# Patient Record
Sex: Male | Born: 1955 | Race: White | Hispanic: No | Marital: Married | Smoking: Never smoker
Health system: Southern US, Community
[De-identification: ages and names within clinical notes are randomized; demographics above are authoritative.]

## PROBLEM LIST (undated history)

## (undated) DIAGNOSIS — S32009A Unspecified fracture of unspecified lumbar vertebra, initial encounter for closed fracture: Secondary | ICD-10-CM

## (undated) DIAGNOSIS — N189 Chronic kidney disease, unspecified: Secondary | ICD-10-CM

## (undated) DIAGNOSIS — I10 Essential (primary) hypertension: Secondary | ICD-10-CM

## (undated) DIAGNOSIS — M1990 Unspecified osteoarthritis, unspecified site: Secondary | ICD-10-CM

## (undated) DIAGNOSIS — K219 Gastro-esophageal reflux disease without esophagitis: Secondary | ICD-10-CM

## (undated) DIAGNOSIS — G4730 Sleep apnea, unspecified: Secondary | ICD-10-CM

## (undated) HISTORY — PX: HERNIA REPAIR: SHX51

## (undated) HISTORY — PX: INGUINAL HERNIA REPAIR: SHX194

## (undated) HISTORY — DX: Unspecified fracture of unspecified lumbar vertebra, initial encounter for closed fracture: S32.009A

---
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
## 2010-04-22 ENCOUNTER — Encounter (INDEPENDENT_AMBULATORY_CARE_PROVIDER_SITE_OTHER): Payer: Self-pay | Admitting: Emergency Medicine

## 2010-04-22 ENCOUNTER — Emergency Department (HOSPITAL_COMMUNITY): Admission: EM | Admit: 2010-04-22 | Discharge: 2010-04-22 | Payer: Self-pay | Admitting: Emergency Medicine

## 2010-04-22 ENCOUNTER — Ambulatory Visit: Payer: Self-pay | Admitting: Vascular Surgery

## 2010-05-16 ENCOUNTER — Encounter
Admission: RE | Admit: 2010-05-16 | Discharge: 2010-08-14 | Payer: Self-pay | Source: Home / Self Care | Admitting: Family Medicine

## 2017-01-29 ENCOUNTER — Telehealth: Payer: Self-pay

## 2017-01-29 NOTE — Telephone Encounter (Signed)
Gastroenterology Pre-Procedure Review  Request Date: Requesting Physician:   PATIENT REVIEW QUESTIONS: The patient responded to the following health history questions as indicated:    1. Diabetes Melitis: NO 2. Joint replacements in the past 12 months: NO 3. Major health problems in the past 3 months: NO 4. Has an artificial valve or MVP: NO 5. Has a defibrillator: NO 6. Has been advised in past to take antibiotics in advance of a procedure like teeth cleaning: NO 7. Family history of colon cancer: NO 8. Alcohol Use: NO 9. History of sleep apnea: YES 10. History of coronary artery or other vascular stents placed within the last 12 months: NO    MEDICATIONS & ALLERGIES:    Patient reports the following regarding taking any blood thinners:   Plavix? NO Aspirin? YES Coumadin? NO Brilinta? NO Xarelto? NO Eliquis? NO Pradaxa? NO Savaysa? NO Effient? NO  Patient confirms/reports the following medications:  Current Outpatient Prescriptions  Medication Sig Dispense Refill  . aspirin EC 81 MG tablet Take 81 mg by mouth daily.    [name] lisinopril (PRINIVIL,ZESTRIL) 2.5 MG tablet Take 2.5 mg by mouth daily.    [name] loratadine (CLARITIN) 10 MG tablet Take 10 mg by mouth daily.    . Multiple Vitamin (MULTIVITAMIN) capsule Take 1 capsule by mouth daily.     No current facility-administered medications for this visit.     Patient confirms/reports the following allergies:  No Known Allergies  No orders of the defined types were placed in this encounter.   AUTHORIZATION INFORMATION Primary Insurance: [place],  [place] #: [id number],  Group #: [id number] Pre-Cert / Berkley Harvey required:  Pre-Cert / Auth #:    SCHEDULE INFORMATION: Procedure has been scheduled as follows:  Date: , Time:   Location:   This Gastroenterology Pre-Precedure Review Form is being routed to the following provider(s):

## 2017-01-29 NOTE — Telephone Encounter (Signed)
[phone]  PATIENT RECEIVED LETTER TO SCHEDULE TCS

## 2017-01-30 NOTE — Telephone Encounter (Signed)
Ok to schedule. He has sleep apnea, have him write down his settings for his machine and bring them to the hospital in case he needs a CPAP after the procedure (if he uses a CPAP machine at home for his sleep apnea)

## 2017-02-04 NOTE — Telephone Encounter (Signed)
Tried to call with no answer  

## 2017-02-04 NOTE — Telephone Encounter (Signed)
Forwarding to Ginger who triaged.  

## 2017-02-05 ENCOUNTER — Other Ambulatory Visit: Payer: Self-pay

## 2017-02-05 DIAGNOSIS — Z1211 Encounter for screening for malignant neoplasm of colon: Secondary | ICD-10-CM

## 2017-02-05 MED ORDER — PEG 3350-KCL-NA BICARB-NACL 420 G PO SOLR: 4000.0000 mL | ORAL | 0 refills | Status: DC

## 2017-02-05 NOTE — Telephone Encounter (Signed)
LMOM to call back

## 2017-02-05 NOTE — Telephone Encounter (Signed)
Pt called back and he is wanting to wait until July. I have him set up for July 9 @ 12:00 pm

## 2017-04-15 ENCOUNTER — Encounter (HOSPITAL_COMMUNITY)
Admission: RE | Disposition: A | Discharge status: Home / Self Care | Payer: Self-pay | Source: Ambulatory Visit | Attending: Internal Medicine

## 2017-04-15 ENCOUNTER — Ambulatory Visit (HOSPITAL_COMMUNITY)
Admission: RE | Admit: 2017-04-15 | Discharge: 2017-04-15 | Disposition: A | Discharge status: Home / Self Care | Payer: BC Managed Care – PPO | Source: Ambulatory Visit | Attending: Internal Medicine | Admitting: Internal Medicine

## 2017-04-15 DIAGNOSIS — Z7982 Long term (current) use of aspirin: Secondary | ICD-10-CM | POA: Insufficient documentation

## 2017-04-15 DIAGNOSIS — Z1211 Encounter for screening for malignant neoplasm of colon: Secondary | ICD-10-CM | POA: Diagnosis present

## 2017-04-15 DIAGNOSIS — Z1212 Encounter for screening for malignant neoplasm of rectum: Secondary | ICD-10-CM | POA: Diagnosis not present

## 2017-04-15 DIAGNOSIS — K5730 Diverticulosis of large intestine without perforation or abscess without bleeding: Secondary | ICD-10-CM | POA: Diagnosis not present

## 2017-04-15 HISTORY — PX: COLONOSCOPY: SHX5424

## 2017-04-15 SURGERY — COLONOSCOPY
Anesthesia: Moderate Sedation

## 2017-04-15 MED ORDER — SODIUM CHLORIDE 0.9 % IV SOLN
INTRAVENOUS | Status: DC
  Administered 2017-04-15: 12:00:00 via INTRAVENOUS

## 2017-04-15 MED ORDER — MIDAZOLAM HCL 5 MG/5ML IJ SOLN
INTRAMUSCULAR | Status: DC | PRN
  Administered 2017-04-15 (×2): 2 mg via INTRAVENOUS

## 2017-04-15 MED ORDER — STERILE WATER FOR IRRIGATION IR SOLN
Status: DC | PRN
  Administered 2017-04-15: 100 mL

## 2017-04-15 MED ORDER — MEPERIDINE HCL 100 MG/ML IJ SOLN
INTRAMUSCULAR | Status: AC
  Filled 2017-04-15: qty 2

## 2017-04-15 MED ORDER — MEPERIDINE HCL 100 MG/ML IJ SOLN
INTRAMUSCULAR | Status: DC | PRN
  Administered 2017-04-15: 25 mg via INTRAVENOUS
  Administered 2017-04-15: 50 mg via INTRAVENOUS

## 2017-04-15 MED ORDER — ONDANSETRON HCL 4 MG/2ML IJ SOLN
INTRAMUSCULAR | Status: DC | PRN
  Administered 2017-04-15: 4 mg via INTRAVENOUS

## 2017-04-15 MED ORDER — ONDANSETRON HCL 4 MG/2ML IJ SOLN
INTRAMUSCULAR | Status: AC
  Filled 2017-04-15: qty 2

## 2017-04-15 MED ORDER — SIMETHICONE 40 MG/0.6ML PO SUSP
ORAL | Status: AC
  Filled 2017-04-15: qty 30

## 2017-04-15 MED ORDER — MIDAZOLAM HCL 5 MG/5ML IJ SOLN
INTRAMUSCULAR | Status: AC
  Filled 2017-04-15: qty 10

## 2017-04-15 NOTE — Op Note (Signed)
[facility] Patient Name: Daniel Patton Procedure Date: 04/15/2017 12:32 PM MRN: [id number] Date of Birth: [birth year] Attending MD: Gennette Pac , MD CSN: [id number] Age: 61 Admit Type: Outpatient Procedure:                Colonoscopy Indications:              Screening for colorectal malignant neoplasm Providers:                Gennette Pac, MD, Edrick Kins, RN, Burke Keels, Technician Referring MD:             Maryla Morrow. Modesto Charon, MD Medicines:                Midazolam 4 mg IV, Meperidine 75 mg IV Complications:            No immediate complications. Estimated Blood Loss:     Estimated blood loss: none. Procedure:                Pre-Anesthesia Assessment:                           - Prior to the procedure, a History and Physical                            was performed, and patient medications and                            allergies were reviewed. The patient's tolerance of                            previous anesthesia was also reviewed. The risks                            and benefits of the procedure and the sedation                            options and risks were discussed with the patient.                            All questions were answered, and informed consent                            was obtained. Prior Anticoagulants: The patient has                            taken no previous anticoagulant or antiplatelet                            agents. ASA Grade Assessment: II - A patient with                            mild systemic disease. After reviewing the risks  and benefits, the patient was deemed in                            satisfactory condition to undergo the procedure.                           After obtaining informed consent, the colonoscope                            was passed under direct vision. Throughout the                            procedure, the patient's blood pressure, pulse,  and                            oxygen saturations were monitored continuously. The                            EC-[id number] ([id number]) scope was introduced through                            the anus and advanced to the the cecum, identified                            by appendiceal orifice and ileocecal valve. The                            ileocecal valve, appendiceal orifice, and rectum                            were photographed. The ileocecal valve, appendiceal                            orifice, and rectum were photographed. The quality                            of the bowel preparation was adequate. Scope In: 12:46:38 PM Scope Out: 12:57:50 PM Scope Withdrawal Time: 0 hours 6 minutes 50 seconds  Total Procedure Duration: 0 hours 11 minutes 12 seconds  Findings:      The perianal and digital rectal examinations were normal.      Scattered small and large-mouthed diverticula were found in the sigmoid       colon and descending colon.      The exam was otherwise without abnormality on direct and retroflexion       views. Impression:               - Diverticulosis in the sigmoid colon and in the                            descending colon.                           - The examination was otherwise normal on direct  and retroflexion views.                           - No specimens collected. Moderate Sedation:      Moderate (conscious) sedation was administered by the endoscopy nurse       and supervised by the endoscopist. The following parameters were       monitored: oxygen saturation, heart rate, blood pressure, respiratory       rate, EKG, adequacy of pulmonary ventilation, and response to care.       Total physician intraservice time was 25 minutes.      Moderate (conscious) sedation was administered by the endoscopy nurse       and supervised by the endoscopist. The following parameters were       monitored: oxygen saturation, heart rate, blood pressure,  respiratory       rate, EKG, adequacy of pulmonary ventilation, and response to care.       Total physician intraservice time was 25 minutes. Recommendation:           - Patient has a contact number available for                            emergencies. The signs and symptoms of potential                            delayed complications were discussed with the                            patient. Return to normal activities tomorrow.                            Written discharge instructions were provided to the                            patient.                           - Resume previous diet.                           - Continue present medications.                           - Repeat colonoscopy in 10 years for screening                            purposes.                           - Return to GI clinic PRN. Procedure Code(s):        --- Professional ---                           [phone], Colonoscopy, flexible; diagnostic, including                            collection of specimen(s) by brushing or washing,  when performed (separate procedure)                           99152, Moderate sedation services provided by the                            same physician or other qualified health care                            professional performing the diagnostic or                            therapeutic service that the sedation supports,                            requiring the presence of an independent trained                            observer to assist in the monitoring of the                            patient's level of consciousness and physiological                            status; initial 15 minutes of intraservice time,                            patient age 33 years or older                           [phone], Moderate sedation services provided by the                            same physician or other qualified health care                             professional performing the diagnostic or                            therapeutic service that the sedation supports,                            requiring the presence of an independent trained                            observer to assist in the monitoring of the                            patient's level of consciousness and physiological                            status; initial 15 minutes of intraservice time,  patient age 23 years or older                           [phone], Moderate sedation services; each additional                            15 minutes intraservice time                           [phone], Moderate sedation services; each additional                            15 minutes intraservice time Diagnosis Code(s):        --- Professional ---                           Z12.11, Encounter for screening for malignant                            neoplasm of colon                           K57.30, Diverticulosis of large intestine without                            perforation or abscess without bleeding CPT copyright 2016 American Medical Association. All rights reserved. The codes documented in this report are preliminary and upon coder review may  be revised to meet current compliance requirements. Gerrit Friends. Rourk, MD Gennette Pac, MD 04/15/2017 1:04:13 PM This report has been signed electronically. Number of Addenda: 0

## 2017-04-15 NOTE — Discharge Instructions (Signed)
°Colonoscopy °Discharge Instructions ° °Read the instructions outlined below and refer to this sheet in the next few weeks. These discharge instructions provide you with general information on caring for yourself after you leave the hospital. Your doctor may also give you specific instructions. While your treatment has been planned according to the most current medical practices available, unavoidable complications occasionally occur. If you have any problems or questions after discharge, call Dr. Rourk at [phone]. °ACTIVITY °· You may resume your regular activity, but move at a slower pace for the next 24 hours.  °· Take frequent rest periods for the next 24 hours.  °· Walking will help get rid of the air and reduce the bloated feeling in your belly (abdomen).  °· No driving for 24 hours (because of the medicine (anesthesia) used during the test).   °· Do not sign any important legal documents or operate any machinery for 24 hours (because of the anesthesia used during the test).  °NUTRITION °· Drink plenty of fluids.  °· You may resume your normal diet as instructed by your doctor.  °· Begin with a light meal and progress to your normal diet. Heavy or fried foods are harder to digest and may make you feel sick to your stomach (nauseated).  °· Avoid alcoholic beverages for 24 hours or as instructed.  °MEDICATIONS °· You may resume your normal medications unless your doctor tells you otherwise.  °WHAT YOU CAN EXPECT TODAY °· Some feelings of bloating in the abdomen.  °· Passage of more gas than usual.  °· Spotting of blood in your stool or on the toilet paper.  °IF YOU HAD POLYPS REMOVED DURING THE COLONOSCOPY: °· No aspirin products for 7 days or as instructed.  °· No alcohol for 7 days or as instructed.  °· Eat a soft diet for the next 24 hours.  °FINDING OUT THE RESULTS OF YOUR TEST °Not all test results are available during your visit. If your test results are not back during the visit, make an appointment  with your caregiver to find out the results. Do not assume everything is normal if you have not heard from your caregiver or the medical facility. It is important for you to follow up on all of your test results.  °SEEK IMMEDIATE MEDICAL ATTENTION IF: °· You have more than a spotting of blood in your stool.  °· Your belly is swollen (abdominal distention).  °· You are nauseated or vomiting.  °· You have a temperature over 101.  °· You have abdominal pain or discomfort that is severe or gets worse throughout the day.  ° ° ° °Colon diverticulosis information provided ° °Recommend 1 more screening colonoscopy in 10 years ° ° °Diverticulosis °Diverticulosis is a condition that develops when small pouches (diverticula) form in the wall of the large intestine (colon). The colon is where water is absorbed and stool is formed. The pouches form when the inside layer of the colon pushes through weak spots in the outer layers of the colon. You may have a few pouches or many of them. °What are the causes? °The cause of this condition is not known. °What increases the risk? °The following factors may make you more likely to develop this condition: °· Being older than age 60. Your risk for this condition increases with age. Diverticulosis is rare among people younger than age 30. By age 80, many people have it. °· Eating a low-fiber diet. °· Having frequent constipation. °· Being overweight. °· Not   getting enough exercise. °· Smoking. °· Taking over-the-counter pain medicines, like aspirin and ibuprofen. °· Having a family history of diverticulosis. ° °What are the signs or symptoms? °In most people, there are no symptoms of this condition. If you do have symptoms, they may include: °· Bloating. °· Cramps in the abdomen. °· Constipation or diarrhea. °· Pain in the lower left side of the abdomen. ° °How is this diagnosed? °This condition is most often diagnosed during an exam for other colon problems. Because diverticulosis  usually has no symptoms, it often cannot be diagnosed independently. This condition may be diagnosed by: °· Using a flexible scope to examine the colon (colonoscopy). °· Taking an X-ray of the colon after dye has been put into the colon (barium enema). °· Doing a CT scan. ° °How is this treated? °You may not need treatment for this condition if you have never developed an infection related to diverticulosis. If you have had an infection before, treatment may include: °· Eating a high-fiber diet. This may include eating more fruits, vegetables, and grains. °· Taking a fiber supplement. °· Taking a live bacteria supplement (probiotic). °· Taking medicine to relax your colon. °· Taking antibiotic medicines. ° °Follow these instructions at home: °· Drink 6-8 glasses of water or more each day to prevent constipation. °· Try not to strain when you have a bowel movement. °· If you have had an infection before: °? Eat more fiber as directed by your health care provider or your diet and nutrition specialist (dietitian). °? Take a fiber supplement or probiotic, if your health care provider approves. °· Take over-the-counter and prescription medicines only as told by your health care provider. °· If you were prescribed an antibiotic, take it as told by your health care provider. Do not stop taking the antibiotic even if you start to feel better. °· Keep all follow-up visits as told by your health care provider. This is important. °Contact a health care provider if: °· You have pain in your abdomen. °· You have bloating. °· You have cramps. °· You have not had a bowel movement in 3 days. °Get help right away if: °· Your pain gets worse. °· Your bloating becomes very bad. °· You have a fever or chills, and your symptoms suddenly get worse. °· You vomit. °· You have bowel movements that are bloody or black. °· You have bleeding from your rectum. °Summary °· Diverticulosis is a condition that develops when small pouches  (diverticula) form in the wall of the large intestine (colon). °· You may have a few pouches or many of them. °· This condition is most often diagnosed during an exam for other colon problems. °· If you have had an infection related to diverticulosis, treatment may include increasing the fiber in your diet, taking supplements, or taking medicines. °This information is not intended to replace advice given to you by your health care provider. Make sure you discuss any questions you have with your health care provider. °Document Released: 06/21/2004 Document Revised: 08/13/2016 Document Reviewed: 08/13/2016 °Elsevier Interactive Patient Education © 2017 [employer] ° °

## 2017-04-15 NOTE — H&P (Signed)
@  [email or web address]@   Primary Care Physician:  Darrin Nipperollege, [facility] [place] Primary Gastroenterologist:  Dr. Jena Gaussourk  Pre-Procedure History & Physical: HPI:  Daniel Patton is a 61 y.o. male here for screening colonoscopy. Reportedly negative colonoscopy and even 10 years ago. No bowel symptoms. No family history of colon cancer.  No past medical history on file.  No past surgical history on file.  Prior to Admission medications   Medication Sig Start Date End Date Taking? Authorizing Provider  acetaminophen (TYLENOL) 500 MG tablet Take 1,000 mg by mouth every 6 (six) hours as needed for mild pain or moderate pain.   Yes [provider]  amLODipine (NORVASC) 2.5 MG tablet Take 2.5 mg by mouth daily. 01/07/17  Yes [provider]  aspirin EC 81 MG tablet Take 81 mg by mouth daily.   Yes [provider]  loratadine (CLARITIN) 10 MG tablet Take 10 mg by mouth daily.   Yes [provider]  Multiple Vitamin (MULTIVITAMIN) capsule Take 1 capsule by mouth daily.   Yes [provider]  omeprazole (PRILOSEC) 20 MG capsule Take 20 mg by mouth daily. 01/07/17  Yes [provider]  tetrahydrozoline-zinc (VISINE-AC) 0.05-0.25 % ophthalmic solution Place 2 drops into both eyes daily as needed (dry, itchy eyes).   Yes [provider]    Allergies as of 02/05/2017  . (No Known Allergies)    No family history on file.  Social History   Social History  . Marital status: Married    Spouse name: N/A  . Number of children: N/A  . Years of education: N/A   Occupational History  . Not on file.   Social History Main Topics  . Smoking status: Not on file  . Smokeless tobacco: Not on file  . Alcohol use Not on file  . Drug use: Unknown  . Sexual activity: Not on file   Other Topics Concern  . Not on file   Social History Narrative  . No narrative on file    Review of Systems: See HPI, otherwise negative ROS  Physical Exam: BP (!)  137/95   Pulse 82   Temp 97.9 F (36.6 C) (Oral)   Resp 20   Ht 5\' 7"  (1.702 m)   Wt 204 lb (92.5 kg)   SpO2 99%   BMI 31.95 kg/m  General:   Alert,  Well-developed, well-nourished, pleasant and cooperative in NAD Neck:  Supple; no masses or thyromegaly. No significant cervical adenopathy. Lungs:  Clear throughout to auscultation.   No wheezes, crackles, or rhonchi. No acute distress. Heart:  Regular rate and rhythm; no murmurs, clicks, rubs,  or gallops. Abdomen: Non-distended, normal bowel sounds.  Soft and nontender without appreciable mass or hepatosplenomegaly.  Pulses:  Normal pulses noted. Extremities:  Without clubbing or edema.  Impression/ Recommendations:  Pleasant 61 year old gentleman here for average risk screening colonoscopy. The risks, benefits, limitations, alternatives and imponderables have been reviewed with the patient. Questions have been answered. All parties are agreeable.     Notice: This dictation was prepared with Dragon dictation along with smaller phrase technology. Any transcriptional errors that result from this process are unintentional and may not be corrected upon review.

## 2017-04-18 ENCOUNTER — Encounter (HOSPITAL_COMMUNITY): Payer: Self-pay | Admitting: Internal Medicine

## 2017-08-08 DIAGNOSIS — S32009A Unspecified fracture of unspecified lumbar vertebra, initial encounter for closed fracture: Secondary | ICD-10-CM

## 2017-08-08 HISTORY — DX: Unspecified fracture of unspecified lumbar vertebra, initial encounter for closed fracture: S32.009A

## 2017-08-30 ENCOUNTER — Inpatient Hospital Stay (HOSPITAL_COMMUNITY)
Admit: 2017-08-30 | Discharge: 2017-09-10 | DRG: 963 | Disposition: A | Discharge status: Rehabilitation Facility | Payer: BC Managed Care – PPO | Attending: Surgery | Admitting: Surgery

## 2017-08-30 ENCOUNTER — Emergency Department (HOSPITAL_COMMUNITY): Payer: BC Managed Care – PPO

## 2017-08-30 DIAGNOSIS — S32018A Other fracture of first lumbar vertebra, initial encounter for closed fracture: Secondary | ICD-10-CM | POA: Diagnosis present

## 2017-08-30 DIAGNOSIS — S32028A Other fracture of second lumbar vertebra, initial encounter for closed fracture: Secondary | ICD-10-CM | POA: Diagnosis present

## 2017-08-30 DIAGNOSIS — S272XXA Traumatic hemopneumothorax, initial encounter: Principal | ICD-10-CM | POA: Diagnosis present

## 2017-08-30 DIAGNOSIS — T508X5A Adverse effect of diagnostic agents, initial encounter: Secondary | ICD-10-CM | POA: Diagnosis not present

## 2017-08-30 DIAGNOSIS — Z79899 Other long term (current) drug therapy: Secondary | ICD-10-CM | POA: Diagnosis not present

## 2017-08-30 DIAGNOSIS — K567 Ileus, unspecified: Secondary | ICD-10-CM | POA: Diagnosis not present

## 2017-08-30 DIAGNOSIS — J9819 Other pulmonary collapse: Secondary | ICD-10-CM | POA: Diagnosis present

## 2017-08-30 DIAGNOSIS — R7989 Other specified abnormal findings of blood chemistry: Secondary | ICD-10-CM | POA: Diagnosis not present

## 2017-08-30 DIAGNOSIS — S060X9S Concussion with loss of consciousness of unspecified duration, sequela: Secondary | ICD-10-CM | POA: Diagnosis not present

## 2017-08-30 DIAGNOSIS — I82492 Acute embolism and thrombosis of other specified deep vein of left lower extremity: Secondary | ICD-10-CM | POA: Diagnosis present

## 2017-08-30 DIAGNOSIS — E872 Acidosis: Secondary | ICD-10-CM | POA: Diagnosis present

## 2017-08-30 DIAGNOSIS — N390 Urinary tract infection, site not specified: Secondary | ICD-10-CM | POA: Diagnosis not present

## 2017-08-30 DIAGNOSIS — D62 Acute posthemorrhagic anemia: Secondary | ICD-10-CM | POA: Diagnosis present

## 2017-08-30 DIAGNOSIS — S22088A Other fracture of T11-T12 vertebra, initial encounter for closed fracture: Secondary | ICD-10-CM | POA: Diagnosis present

## 2017-08-30 DIAGNOSIS — Z882 Allergy status to sulfonamides status: Secondary | ICD-10-CM | POA: Diagnosis not present

## 2017-08-30 DIAGNOSIS — S32009S Unspecified fracture of unspecified lumbar vertebra, sequela: Secondary | ICD-10-CM | POA: Diagnosis not present

## 2017-08-30 DIAGNOSIS — S271XXD Traumatic hemothorax, subsequent encounter: Secondary | ICD-10-CM | POA: Diagnosis not present

## 2017-08-30 DIAGNOSIS — N183 Chronic kidney disease, stage 3 (moderate): Secondary | ICD-10-CM | POA: Diagnosis present

## 2017-08-30 DIAGNOSIS — N170 Acute kidney failure with tubular necrosis: Secondary | ICD-10-CM | POA: Diagnosis present

## 2017-08-30 DIAGNOSIS — T07XXXA Unspecified multiple injuries, initial encounter: Secondary | ICD-10-CM | POA: Diagnosis not present

## 2017-08-30 DIAGNOSIS — R402252 Coma scale, best verbal response, oriented, at arrival to emergency department: Secondary | ICD-10-CM | POA: Diagnosis present

## 2017-08-30 DIAGNOSIS — Z9689 Presence of other specified functional implants: Secondary | ICD-10-CM

## 2017-08-30 DIAGNOSIS — Z7982 Long term (current) use of aspirin: Secondary | ICD-10-CM | POA: Diagnosis not present

## 2017-08-30 DIAGNOSIS — S334XXA Traumatic rupture of symphysis pubis, initial encounter: Secondary | ICD-10-CM | POA: Diagnosis present

## 2017-08-30 DIAGNOSIS — J942 Hemothorax: Secondary | ICD-10-CM

## 2017-08-30 DIAGNOSIS — S35403A Unspecified injury of unspecified renal artery, initial encounter: Secondary | ICD-10-CM | POA: Diagnosis present

## 2017-08-30 DIAGNOSIS — I82442 Acute embolism and thrombosis of left tibial vein: Secondary | ICD-10-CM | POA: Diagnosis present

## 2017-08-30 DIAGNOSIS — S36039A Unspecified laceration of spleen, initial encounter: Secondary | ICD-10-CM | POA: Diagnosis present

## 2017-08-30 DIAGNOSIS — S35402A Unspecified injury of left renal artery, initial encounter: Secondary | ICD-10-CM | POA: Diagnosis present

## 2017-08-30 DIAGNOSIS — Z23 Encounter for immunization: Secondary | ICD-10-CM

## 2017-08-30 DIAGNOSIS — S330XXA Traumatic rupture of lumbar intervertebral disc, initial encounter: Secondary | ICD-10-CM | POA: Diagnosis present

## 2017-08-30 DIAGNOSIS — R402142 Coma scale, eyes open, spontaneous, at arrival to emergency department: Secondary | ICD-10-CM | POA: Diagnosis present

## 2017-08-30 DIAGNOSIS — S060X9A Concussion with loss of consciousness of unspecified duration, initial encounter: Secondary | ICD-10-CM | POA: Diagnosis present

## 2017-08-30 DIAGNOSIS — R000 Tachycardia, unspecified: Secondary | ICD-10-CM | POA: Diagnosis not present

## 2017-08-30 DIAGNOSIS — S36039D Unspecified laceration of spleen, subsequent encounter: Secondary | ICD-10-CM | POA: Diagnosis not present

## 2017-08-30 DIAGNOSIS — I959 Hypotension, unspecified: Secondary | ICD-10-CM | POA: Diagnosis present

## 2017-08-30 DIAGNOSIS — T1490XA Injury, unspecified, initial encounter: Secondary | ICD-10-CM

## 2017-08-30 DIAGNOSIS — D735 Infarction of spleen: Secondary | ICD-10-CM | POA: Diagnosis present

## 2017-08-30 DIAGNOSIS — N179 Acute kidney failure, unspecified: Secondary | ICD-10-CM | POA: Diagnosis present

## 2017-08-30 DIAGNOSIS — S060X0S Concussion without loss of consciousness, sequela: Secondary | ICD-10-CM | POA: Diagnosis not present

## 2017-08-30 DIAGNOSIS — R402362 Coma scale, best motor response, obeys commands, at arrival to emergency department: Secondary | ICD-10-CM | POA: Diagnosis present

## 2017-08-30 DIAGNOSIS — S32018D Other fracture of first lumbar vertebra, subsequent encounter for fracture with routine healing: Secondary | ICD-10-CM | POA: Diagnosis present

## 2017-08-30 DIAGNOSIS — B9620 Unspecified Escherichia coli [E. coli] as the cause of diseases classified elsewhere: Secondary | ICD-10-CM | POA: Diagnosis not present

## 2017-08-30 DIAGNOSIS — Z91013 Allergy to seafood: Secondary | ICD-10-CM

## 2017-08-30 DIAGNOSIS — Z881 Allergy status to other antibiotic agents status: Secondary | ICD-10-CM

## 2017-08-30 DIAGNOSIS — S2242XA Multiple fractures of ribs, left side, initial encounter for closed fracture: Secondary | ICD-10-CM

## 2017-08-30 DIAGNOSIS — M7989 Other specified soft tissue disorders: Secondary | ICD-10-CM | POA: Diagnosis not present

## 2017-08-30 DIAGNOSIS — Z4682 Encounter for fitting and adjustment of non-vascular catheter: Secondary | ICD-10-CM

## 2017-08-30 DIAGNOSIS — J939 Pneumothorax, unspecified: Secondary | ICD-10-CM

## 2017-08-30 DIAGNOSIS — T859XXA Unspecified complication of internal prosthetic device, implant and graft, initial encounter: Secondary | ICD-10-CM

## 2017-08-30 DIAGNOSIS — K560 Paralytic ileus: Secondary | ICD-10-CM

## 2017-08-30 DIAGNOSIS — I824Z2 Acute embolism and thrombosis of unspecified deep veins of left distal lower extremity: Secondary | ICD-10-CM | POA: Diagnosis not present

## 2017-08-30 DIAGNOSIS — S2239XA Fracture of one rib, unspecified side, initial encounter for closed fracture: Secondary | ICD-10-CM

## 2017-08-30 DIAGNOSIS — S32028D Other fracture of second lumbar vertebra, subsequent encounter for fracture with routine healing: Secondary | ICD-10-CM | POA: Diagnosis not present

## 2017-08-30 DIAGNOSIS — M79662 Pain in left lower leg: Secondary | ICD-10-CM | POA: Diagnosis not present

## 2017-08-30 DIAGNOSIS — A499 Bacterial infection, unspecified: Secondary | ICD-10-CM | POA: Diagnosis not present

## 2017-08-30 DIAGNOSIS — E875 Hyperkalemia: Secondary | ICD-10-CM | POA: Diagnosis not present

## 2017-08-30 DIAGNOSIS — S2242XD Multiple fractures of ribs, left side, subsequent encounter for fracture with routine healing: Secondary | ICD-10-CM | POA: Diagnosis not present

## 2017-08-30 DIAGNOSIS — I129 Hypertensive chronic kidney disease with stage 1 through stage 4 chronic kidney disease, or unspecified chronic kidney disease: Secondary | ICD-10-CM | POA: Diagnosis present

## 2017-08-30 DIAGNOSIS — M545 Low back pain: Secondary | ICD-10-CM | POA: Diagnosis present

## 2017-08-30 LAB — ABO/RH: ABO/RH(D): A POS

## 2017-08-30 LAB — CBC WITH DIFFERENTIAL/PLATELET
Basophils Absolute: 0 10*3/uL (ref 0.0–0.1)
Basophils Relative: 0 %
Eosinophils Absolute: 0.3 10*3/uL (ref 0.0–0.7)
Eosinophils Relative: 2 %
HCT: 33.6 % — ABNORMAL LOW (ref 39.0–52.0)
Hemoglobin: 11.5 g/dL — ABNORMAL LOW (ref 13.0–17.0)
Lymphocytes Relative: 26 %
Lymphs Abs: 4.4 10*3/uL — ABNORMAL HIGH (ref 0.7–4.0)
MCH: 31.3 pg (ref 26.0–34.0)
MCHC: 34.2 g/dL (ref 30.0–36.0)
MCV: 91.3 fL (ref 78.0–100.0)
Monocytes Absolute: 0.9 10*3/uL (ref 0.1–1.0)
Monocytes Relative: 5 %
Neutro Abs: 11.5 10*3/uL — ABNORMAL HIGH (ref 1.7–7.7)
Neutrophils Relative %: 67 %
Platelets: 219 10*3/uL (ref 150–400)
RBC: 3.68 MIL/uL — ABNORMAL LOW (ref 4.22–5.81)
RDW: 12.9 % (ref 11.5–15.5)
WBC: 17.1 10*3/uL — ABNORMAL HIGH (ref 4.0–10.5)

## 2017-08-30 LAB — CBC
HCT: 37.7 % — ABNORMAL LOW (ref 39.0–52.0)
Hemoglobin: 13.2 g/dL (ref 13.0–17.0)
MCH: 30.8 pg (ref 26.0–34.0)
MCHC: 35 g/dL (ref 30.0–36.0)
MCV: 87.9 fL (ref 78.0–100.0)
PLATELETS: 178 10*3/uL (ref 150–400)
RBC: 4.29 MIL/uL (ref 4.22–5.81)
RDW: 14.4 % (ref 11.5–15.5)
WBC: 16.4 10*3/uL — AB (ref 4.0–10.5)

## 2017-08-30 LAB — I-STAT CG4 LACTIC ACID, ED: LACTIC ACID, VENOUS: 3.62 mmol/L — AB (ref 0.5–1.9)

## 2017-08-30 LAB — PREPARE FRESH FROZEN PLASMA
UNIT DIVISION: 0
Unit division: 0

## 2017-08-30 LAB — COMPREHENSIVE METABOLIC PANEL
ALT: 61 U/L (ref 17–63)
AST: 88 U/L — ABNORMAL HIGH (ref 15–41)
Albumin: 3.2 g/dL — ABNORMAL LOW (ref 3.5–5.0)
Alkaline Phosphatase: 46 U/L (ref 38–126)
Anion gap: 8 (ref 5–15)
BUN: 16 mg/dL (ref 6–20)
CO2: 21 mmol/L — ABNORMAL LOW (ref 22–32)
Calcium: 8.2 mg/dL — ABNORMAL LOW (ref 8.9–10.3)
Chloride: 112 mmol/L — ABNORMAL HIGH (ref 101–111)
Creatinine, Ser: 1.66 mg/dL — ABNORMAL HIGH (ref 0.61–1.24)
GFR calc Af Amer: 28 mL/min — ABNORMAL LOW (ref >60)
GFR calc non Af Amer: 24 mL/min — ABNORMAL LOW (ref >60)
Glucose, Bld: 134 mg/dL — ABNORMAL HIGH (ref 65–99)
Potassium: 4 mmol/L (ref 3.5–5.1)
Sodium: 141 mmol/L (ref 135–145)
Total Bilirubin: 1.2 mg/dL (ref 0.3–1.2)
Total Protein: 5.5 g/dL — ABNORMAL LOW (ref 6.5–8.1)

## 2017-08-30 LAB — BPAM FFP
BLOOD PRODUCT EXPIRATION DATE: 201812012359
BLOOD PRODUCT EXPIRATION DATE: 201812042359
ISSUE DATE / TIME: 201811231403
ISSUE DATE / TIME: 201811231403
UNIT TYPE AND RH: 6200
Unit Type and Rh: 600

## 2017-08-30 LAB — APTT: APTT: 26 s (ref 24–36)

## 2017-08-30 LAB — ETHANOL

## 2017-08-30 LAB — LACTIC ACID, PLASMA: Lactic Acid, Venous: 2.3 mmol/L (ref 0.5–1.9)

## 2017-08-30 LAB — I-STAT CHEM 8, ED
BUN: 18 mg/dL (ref 6–20)
CREATININE: 1.6 mg/dL — AB (ref 0.61–1.24)
Calcium, Ion: 1.09 mmol/L — ABNORMAL LOW (ref 1.15–1.40)
Chloride: 108 mmol/L (ref 101–111)
GLUCOSE: 134 mg/dL — AB (ref 65–99)
HEMATOCRIT: 32 % — AB (ref 39.0–52.0)
HEMOGLOBIN: 10.9 g/dL — AB (ref 13.0–17.0)
POTASSIUM: 3.8 mmol/L (ref 3.5–5.1)
Sodium: 141 mmol/L (ref 135–145)
TCO2: 22 mmol/L (ref 22–32)

## 2017-08-30 LAB — CDS SEROLOGY

## 2017-08-30 LAB — MRSA PCR SCREENING: MRSA by PCR: NEGATIVE

## 2017-08-30 LAB — FIBRINOGEN: Fibrinogen: 299 mg/dL (ref 210–475)

## 2017-08-30 LAB — PROTIME-INR
INR: 1.27
PROTHROMBIN TIME: 15.8 s — AB (ref 11.4–15.2)

## 2017-08-30 LAB — PREPARE RBC (CROSSMATCH)

## 2017-08-30 MED ORDER — ONDANSETRON HCL 4 MG/2ML IJ SOLN
4.0000 mg | Freq: Once | INTRAMUSCULAR | Status: AC
  Administered 2017-08-30: 4 mg via INTRAVENOUS

## 2017-08-30 MED ORDER — SODIUM CHLORIDE 0.9 % IV BOLUS (SEPSIS)
2000.0000 mL | Freq: Once | INTRAVENOUS | Status: AC
  Administered 2017-08-30: 2000 mL via INTRAVENOUS

## 2017-08-30 MED ORDER — METOPROLOL TARTRATE 5 MG/5ML IV SOLN: 5.0000 mg | Freq: Four times a day (QID) | INTRAVENOUS | Status: DC | PRN

## 2017-08-30 MED ORDER — METHOCARBAMOL 1000 MG/10ML IJ SOLN
500.0000 mg | Freq: Four times a day (QID) | INTRAVENOUS | Status: DC
  Administered 2017-08-30 – 2017-09-07 (×31): 500 mg via INTRAVENOUS
  Filled 2017-08-30: qty 5
  Filled 2017-08-30: qty 550
  Filled 2017-08-30 (×11): qty 5
  Filled 2017-08-30: qty 550
  Filled 2017-08-30 (×2): qty 5
  Filled 2017-08-30: qty 550
  Filled 2017-08-30 (×4): qty 5
  Filled 2017-08-30: qty 550
  Filled 2017-08-30 (×3): qty 5
  Filled 2017-08-30 (×3): qty 550
  Filled 2017-08-30 (×2): qty 5
  Filled 2017-08-30: qty 550
  Filled 2017-08-30 (×3): qty 5

## 2017-08-30 MED ORDER — ACETAMINOPHEN 10 MG/ML IV SOLN
1000.0000 mg | Freq: Four times a day (QID) | INTRAVENOUS | Status: AC
  Administered 2017-08-30 – 2017-08-31 (×4): 1000 mg via INTRAVENOUS
  Filled 2017-08-30 (×4): qty 100

## 2017-08-30 MED ORDER — DOCUSATE SODIUM 100 MG PO CAPS: 100.0000 mg | ORAL_CAPSULE | Freq: Two times a day (BID) | ORAL | Status: DC

## 2017-08-30 MED ORDER — OXYCODONE HCL 5 MG PO TABS: 5.0000 mg | ORAL_TABLET | ORAL | Status: DC | PRN

## 2017-08-30 MED ORDER — SODIUM CHLORIDE 0.9 % IV BOLUS (SEPSIS)
1000.0000 mL | Freq: Once | INTRAVENOUS | Status: AC
  Administered 2017-08-30: 1000 mL via INTRAVENOUS

## 2017-08-30 MED ORDER — HYDRALAZINE HCL 20 MG/ML IJ SOLN: 10.0000 mg | INTRAMUSCULAR | Status: DC | PRN

## 2017-08-30 MED ORDER — IOPAMIDOL (ISOVUE-300) INJECTION 61%
100.0000 mL | Freq: Once | INTRAVENOUS | Status: AC | PRN
  Administered 2017-08-30: 100 mL via INTRAVENOUS

## 2017-08-30 MED ORDER — FENTANYL CITRATE (PF) 100 MCG/2ML IJ SOLN
INTRAMUSCULAR | Status: AC | PRN
  Administered 2017-08-30: 100 ug via INTRAVENOUS

## 2017-08-30 MED ORDER — ONDANSETRON HCL 4 MG/2ML IJ SOLN
4.0000 mg | Freq: Four times a day (QID) | INTRAMUSCULAR | Status: DC | PRN
  Administered 2017-08-30 – 2017-09-02 (×4): 4 mg via INTRAVENOUS
  Filled 2017-08-30 (×4): qty 2

## 2017-08-30 MED ORDER — ACETAMINOPHEN 325 MG PO TABS: 650.0000 mg | ORAL_TABLET | ORAL | Status: DC | PRN

## 2017-08-30 MED ORDER — FENTANYL CITRATE (PF) 100 MCG/2ML IJ SOLN
25.0000 ug | INTRAMUSCULAR | Status: DC | PRN
  Administered 2017-08-30 (×3): 50 ug via INTRAVENOUS
  Administered 2017-08-31: 25 ug via INTRAVENOUS
  Administered 2017-08-31 (×3): 50 ug via INTRAVENOUS
  Administered 2017-08-31: 25 ug via INTRAVENOUS
  Administered 2017-08-31: 50 ug via INTRAVENOUS
  Administered 2017-08-31: 25 ug via INTRAVENOUS
  Administered 2017-08-31 – 2017-09-02 (×5): 50 ug via INTRAVENOUS
  Filled 2017-08-30 (×15): qty 2

## 2017-08-30 MED ORDER — PANTOPRAZOLE SODIUM 40 MG PO TBEC
40.0000 mg | DELAYED_RELEASE_TABLET | Freq: Every day | ORAL | Status: DC
  Administered 2017-09-01 – 2017-09-10 (×7): 40 mg via ORAL
  Filled 2017-08-30 (×7): qty 1

## 2017-08-30 MED ORDER — TETANUS-DIPHTH-ACELL PERTUSSIS 5-2.5-18.5 LF-MCG/0.5 IM SUSP
0.5000 mL | Freq: Once | INTRAMUSCULAR | Status: AC
  Administered 2017-08-30: 0.5 mL via INTRAMUSCULAR
  Filled 2017-08-30: qty 0.5

## 2017-08-30 MED ORDER — FENTANYL CITRATE (PF) 100 MCG/2ML IJ SOLN
INTRAMUSCULAR | Status: AC | PRN
  Administered 2017-08-30: 50 ug via INTRAVENOUS

## 2017-08-30 MED ORDER — SODIUM CHLORIDE 0.9 % IV SOLN: Freq: Once | INTRAVENOUS | Status: DC

## 2017-08-30 MED ORDER — FENTANYL CITRATE (PF) 100 MCG/2ML IJ SOLN
INTRAMUSCULAR | Status: AC
  Filled 2017-08-30: qty 2

## 2017-08-30 MED ORDER — ONDANSETRON 4 MG PO TBDP
4.0000 mg | ORAL_TABLET | Freq: Four times a day (QID) | ORAL | Status: DC | PRN
Start: 2017-08-30 — End: 2017-09-10

## 2017-08-30 MED ORDER — PANTOPRAZOLE SODIUM 40 MG IV SOLR
40.0000 mg | Freq: Every day | INTRAVENOUS | Status: DC
  Administered 2017-08-31 – 2017-09-04 (×5): 40 mg via INTRAVENOUS
  Filled 2017-08-30 (×5): qty 40

## 2017-08-30 MED ORDER — HYDROMORPHONE HCL 1 MG/ML IJ SOLN
0.5000 mg | INTRAMUSCULAR | Status: DC | PRN
  Administered 2017-08-30 – 2017-08-31 (×7): 0.5 mg via INTRAVENOUS
  Filled 2017-08-30 (×5): qty 0.5
  Filled 2017-08-30: qty 1
  Filled 2017-08-30: qty 0.5

## 2017-08-30 MED ORDER — OXYCODONE HCL 5 MG PO TABS: 10.0000 mg | ORAL_TABLET | ORAL | Status: DC | PRN

## 2017-08-30 MED ORDER — ONDANSETRON HCL 4 MG/2ML IJ SOLN
4.0000 mg | Freq: Once | INTRAMUSCULAR | Status: AC
  Administered 2017-08-30: 4 mg via INTRAVENOUS
  Filled 2017-08-30: qty 2

## 2017-08-30 MED ORDER — SODIUM CHLORIDE 0.9 % IV SOLN
INTRAVENOUS | Status: DC
  Administered 2017-08-30 – 2017-09-04 (×4): via INTRAVENOUS
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
## 2017-08-30 NOTE — ED Notes (Signed)
2nd unit of blood finished at 14:59
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
## 2017-08-30 NOTE — ED Notes (Addendum)
2nd unit of blood started at 1454 per Dr. Doylene Canardonner.
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
## 2017-08-30 NOTE — ED Notes (Addendum)
First unit of blood started at 14:47 per Dr. Fredricka Bonineonnor.

## 2017-08-30 NOTE — Progress Notes (Addendum)
Patient continues to complain of back pain only. He is alert and appropriate.  Now in ICU. BP has drifted back to low 80s systolic, MAPs in mid 60s. He maintained SBP in low 100s for over an hour before leaving the ER, sounds like he did get a dose of dilaudid on the way up. Chest tube output has slowed down, only about 50 in the last hour.  He likely has ongoing retroperitoneal venous bleeding which hopefully will stop. Transfuse 2 more units PRBC now, will order FFP and PLT as well

## 2017-08-30 NOTE — ED Notes (Signed)
Dr. Juleen ChinaKohut aware of lactic acid result of 3.6

## 2017-08-30 NOTE — ED Notes (Signed)
1st unit of blood finished at 1453
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
## 2017-08-30 NOTE — Procedures (Signed)
Chest Tube Insertion Procedure Note  Indications:  Clinically significant Pneumothorax and Hemothorax  Pre-operative Diagnosis: Pneumothorax and Hemothorax  Post-operative Diagnosis: Pneumothorax and Hemothorax  Procedure Details  Informed consent was obtained for the procedure  Risks of lung perforation, hemorrhage, arrhythmia, and adverse drug reaction were discussed.   After sterile skin prep, using standard technique, a 14 French tube was placed in the left lateral 5th rib space.  Findings: Blood and air return, initial blood out  Estimated Blood Loss:  Minimal         Specimens:  None              Complications:  None; patient tolerated the procedure well.         Disposition: to be determined         Condition: guarded  Attending Attestation: I performed the procedure.

## 2017-08-30 NOTE — ED Notes (Signed)
Report given to 4N RN.  Patient stable and able to be transported at this time. All belongings taken with patient to the room.  Family at bedside and is going to see other family then going to floor.  Patient is alert to transport.

## 2017-08-31 ENCOUNTER — Inpatient Hospital Stay (HOSPITAL_COMMUNITY): Payer: BC Managed Care – PPO

## 2017-08-31 LAB — CBC
HCT: 37.9 % — ABNORMAL LOW (ref 39.0–52.0)
HCT: 33.1 % — ABNORMAL LOW (ref 39.0–52.0)
HEMATOCRIT: 38.5 % — AB (ref 39.0–52.0)
HEMOGLOBIN: 12.9 g/dL — AB (ref 13.0–17.0)
Hemoglobin: 12.8 g/dL — ABNORMAL LOW (ref 13.0–17.0)
Hemoglobin: 11.2 g/dL — ABNORMAL LOW (ref 13.0–17.0)
MCH: 29.5 pg (ref 26.0–34.0)
MCH: 29.6 pg (ref 26.0–34.0)
MCH: 29.9 pg (ref 26.0–34.0)
MCHC: 34 g/dL (ref 30.0–36.0)
MCHC: 33.2 g/dL (ref 30.0–36.0)
MCHC: 33.8 g/dL (ref 30.0–36.0)
MCV: 86.7 fL (ref 78.0–100.0)
MCV: 88.9 fL (ref 78.0–100.0)
MCV: 88.5 fL (ref 78.0–100.0)
PLATELETS: 184 10*3/uL (ref 150–400)
PLATELETS: 209 10*3/uL (ref 150–400)
Platelets: 181 10*3/uL (ref 150–400)
RBC: 4.37 MIL/uL (ref 4.22–5.81)
RBC: 4.33 MIL/uL (ref 4.22–5.81)
RBC: 3.74 MIL/uL — AB (ref 4.22–5.81)
RDW: 14.9 % (ref 11.5–15.5)
RDW: 15.6 % — AB (ref 11.5–15.5)
RDW: 15.6 % — ABNORMAL HIGH (ref 11.5–15.5)
WBC: 13.9 10*3/uL — AB (ref 4.0–10.5)
WBC: 13.8 10*3/uL — ABNORMAL HIGH (ref 4.0–10.5)
WBC: 13.8 10*3/uL — ABNORMAL HIGH (ref 4.0–10.5)

## 2017-08-31 LAB — PREPARE PLATELET PHERESIS: UNIT DIVISION: 0

## 2017-08-31 LAB — URINALYSIS, ROUTINE W REFLEX MICROSCOPIC
Bilirubin Urine: NEGATIVE
Glucose, UA: NEGATIVE mg/dL
Ketones, ur: NEGATIVE mg/dL
Leukocytes, UA: NEGATIVE
NITRITE: NEGATIVE
PH: 5 (ref 5.0–8.0)
Protein, ur: 100 mg/dL — AB
SPECIFIC GRAVITY, URINE: 1.032 — AB (ref 1.005–1.030)

## 2017-08-31 LAB — HIV ANTIBODY (ROUTINE TESTING W REFLEX): HIV SCREEN 4TH GENERATION: NONREACTIVE

## 2017-08-31 LAB — COMPREHENSIVE METABOLIC PANEL
ALBUMIN: 2.7 g/dL — AB (ref 3.5–5.0)
ALK PHOS: 38 U/L (ref 38–126)
ALT: 66 U/L — ABNORMAL HIGH (ref 17–63)
AST: 128 U/L — AB (ref 15–41)
Anion gap: 7 (ref 5–15)
BILIRUBIN TOTAL: 1.8 mg/dL — AB (ref 0.3–1.2)
BUN: 24 mg/dL — AB (ref 6–20)
CALCIUM: 7.2 mg/dL — AB (ref 8.9–10.3)
CO2: 17 mmol/L — ABNORMAL LOW (ref 22–32)
Chloride: 114 mmol/L — ABNORMAL HIGH (ref 101–111)
Creatinine, Ser: 2.32 mg/dL — ABNORMAL HIGH (ref 0.61–1.24)
GFR calc Af Amer: 33 mL/min — ABNORMAL LOW (ref >60)
GFR, EST NON AFRICAN AMERICAN: 28 mL/min — AB (ref >60)
GLUCOSE: 143 mg/dL — AB (ref 65–99)
Potassium: 5.2 mmol/L — ABNORMAL HIGH (ref 3.5–5.1)
Sodium: 138 mmol/L (ref 135–145)
TOTAL PROTEIN: 5 g/dL — AB (ref 6.5–8.1)

## 2017-08-31 LAB — BPAM PLATELET PHERESIS
BLOOD PRODUCT EXPIRATION DATE: 201811252359
ISSUE DATE / TIME: 201811231824
Unit Type and Rh: 600

## 2017-08-31 LAB — BPAM FFP
Blood Product Expiration Date: 201811282359
ISSUE DATE / TIME: 201811232018
Unit Type and Rh: 6200

## 2017-08-31 LAB — PREPARE FRESH FROZEN PLASMA: UNIT DIVISION: 0

## 2017-08-31 LAB — LACTIC ACID, PLASMA: LACTIC ACID, VENOUS: 1.9 mmol/L (ref 0.5–1.9)

## 2017-08-31 LAB — SODIUM, URINE, RANDOM: Sodium, Ur: 30 mmol/L

## 2017-08-31 LAB — CREATININE, URINE, RANDOM: CREATININE, URINE: 127.43 mg/dL

## 2017-08-31 LAB — CK: Total CK: 4541 U/L — ABNORMAL HIGH (ref 49–397)

## 2017-08-31 MED ORDER — WHITE PETROLATUM EX OINT
TOPICAL_OINTMENT | CUTANEOUS | Status: AC
  Filled 2017-08-31: qty 28.35

## 2017-08-31 MED ORDER — ORAL CARE MOUTH RINSE
15.0000 mL | Freq: Two times a day (BID) | OROMUCOSAL | Status: DC
  Administered 2017-08-31 – 2017-09-04 (×9): 15 mL via OROMUCOSAL

## 2017-08-31 MED ORDER — SODIUM BICARBONATE 8.4 % IV SOLN
INTRAVENOUS | Status: DC
  Administered 2017-08-31 – 2017-09-03 (×5): via INTRAVENOUS
  Filled 2017-08-31 (×6): qty 1000

## 2017-08-31 MED ORDER — ACETAMINOPHEN 10 MG/ML IV SOLN
1000.0000 mg | Freq: Four times a day (QID) | INTRAVENOUS | Status: AC
  Administered 2017-08-31 – 2017-09-01 (×4): 1000 mg via INTRAVENOUS
  Filled 2017-08-31 (×4): qty 100

## 2017-08-31 MED ORDER — HYDROMORPHONE HCL 1 MG/ML IJ SOLN
0.5000 mg | INTRAMUSCULAR | Status: DC | PRN
  Administered 2017-08-31 – 2017-09-01 (×11): 1 mg via INTRAVENOUS
  Administered 2017-09-01: 0.5 mg via INTRAVENOUS
  Administered 2017-09-01 – 2017-09-02 (×9): 1 mg via INTRAVENOUS
  Filled 2017-08-31 (×8): qty 1
  Filled 2017-08-31: qty 0.5
  Filled 2017-08-31 (×12): qty 1
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
## 2017-08-31 NOTE — Plan of Care (Signed)
Discussed with pt and his wife at bedside, pain management and use of Is. Both pt and his wife verbalized understanding that pain management will help reduce pain to more tolerable levels and the importance of using the IS for lung health promotion. Will continue to monitor and re-educate as needed on these topics.
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
## 2017-09-01 ENCOUNTER — Inpatient Hospital Stay (HOSPITAL_COMMUNITY): Payer: BC Managed Care – PPO

## 2017-09-01 LAB — CBC
HEMATOCRIT: 26.6 % — AB (ref 39.0–52.0)
HEMATOCRIT: 23.5 % — AB (ref 39.0–52.0)
HEMOGLOBIN: 8 g/dL — AB (ref 13.0–17.0)
Hemoglobin: 9.1 g/dL — ABNORMAL LOW (ref 13.0–17.0)
MCH: 30.4 pg (ref 26.0–34.0)
MCH: 30.7 pg (ref 26.0–34.0)
MCHC: 34.2 g/dL (ref 30.0–36.0)
MCHC: 34 g/dL (ref 30.0–36.0)
MCV: 89 fL (ref 78.0–100.0)
MCV: 90 fL (ref 78.0–100.0)
PLATELETS: 171 10*3/uL (ref 150–400)
Platelets: 170 10*3/uL (ref 150–400)
RBC: 2.99 MIL/uL — AB (ref 4.22–5.81)
RBC: 2.61 MIL/uL — AB (ref 4.22–5.81)
RDW: 15.5 % (ref 11.5–15.5)
RDW: 15.3 % (ref 11.5–15.5)
WBC: 20.2 10*3/uL — AB (ref 4.0–10.5)
WBC: 17.5 10*3/uL — AB (ref 4.0–10.5)

## 2017-09-01 LAB — RENAL FUNCTION PANEL
Albumin: 2.2 g/dL — ABNORMAL LOW (ref 3.5–5.0)
Albumin: 2.3 g/dL — ABNORMAL LOW (ref 3.5–5.0)
Anion gap: 11 (ref 5–15)
Anion gap: 8 (ref 5–15)
BUN: 46 mg/dL — AB (ref 6–20)
BUN: 49 mg/dL — ABNORMAL HIGH (ref 6–20)
CHLORIDE: 107 mmol/L (ref 101–111)
CO2: 12 mmol/L — AB (ref 22–32)
CO2: 21 mmol/L — AB (ref 22–32)
CREATININE: 4.28 mg/dL — AB (ref 0.61–1.24)
Calcium: 7.4 mg/dL — ABNORMAL LOW (ref 8.9–10.3)
Calcium: 7.4 mg/dL — ABNORMAL LOW (ref 8.9–10.3)
Chloride: 114 mmol/L — ABNORMAL HIGH (ref 101–111)
Creatinine, Ser: 4.27 mg/dL — ABNORMAL HIGH (ref 0.61–1.24)
GFR calc non Af Amer: 14 mL/min — ABNORMAL LOW (ref >60)
GFR, EST AFRICAN AMERICAN: 16 mL/min — AB (ref >60)
GFR, EST AFRICAN AMERICAN: 16 mL/min — AB (ref >60)
GFR, EST NON AFRICAN AMERICAN: 14 mL/min — AB (ref >60)
Glucose, Bld: 124 mg/dL — ABNORMAL HIGH (ref 65–99)
Glucose, Bld: 144 mg/dL — ABNORMAL HIGH (ref 65–99)
POTASSIUM: 5.6 mmol/L — AB (ref 3.5–5.1)
POTASSIUM: 5.3 mmol/L — AB (ref 3.5–5.1)
Phosphorus: 5.9 mg/dL — ABNORMAL HIGH (ref 2.5–4.6)
Phosphorus: 5.7 mg/dL — ABNORMAL HIGH (ref 2.5–4.6)
Sodium: 137 mmol/L (ref 135–145)
Sodium: 136 mmol/L (ref 135–145)

## 2017-09-01 LAB — PREPARE RBC (CROSSMATCH)

## 2017-09-01 MED ORDER — SODIUM CHLORIDE 0.9 % IV SOLN
Freq: Once | INTRAVENOUS | Status: AC
  Administered 2017-09-04: 13:00:00 via INTRAVENOUS

## 2017-09-01 MED ORDER — SODIUM BICARBONATE 8.4 % IV SOLN
50.0000 meq | Freq: Once | INTRAVENOUS | Status: AC
Start: 2017-09-01 — End: 2017-09-01
  Administered 2017-09-01: 50 meq via INTRAVENOUS
  Filled 2017-09-01: qty 50

## 2017-09-01 MED ORDER — SODIUM CHLORIDE 0.9 % IV BOLUS (SEPSIS)
500.0000 mL | Freq: Once | INTRAVENOUS | Status: AC
  Administered 2017-09-01: 500 mL via INTRAVENOUS

## 2017-09-01 MED ORDER — GABAPENTIN 300 MG PO CAPS
300.0000 mg | ORAL_CAPSULE | Freq: Three times a day (TID) | ORAL | Status: DC
  Administered 2017-09-01 – 2017-09-02 (×3): 300 mg via ORAL
  Filled 2017-09-01 (×3): qty 1

## 2017-09-01 MED ORDER — HYDROMORPHONE HCL 1 MG/ML IJ SOLN
0.5000 mg | Freq: Once | INTRAMUSCULAR | Status: AC
  Administered 2017-09-01: 0.5 mg via INTRAVENOUS
  Filled 2017-09-01: qty 0.5

## 2017-09-01 MED ORDER — SODIUM POLYSTYRENE SULFONATE 15 GM/60ML PO SUSP
30.0000 g | Freq: Once | ORAL | Status: AC
Start: 2017-09-01 — End: 2017-09-01
  Administered 2017-09-01: 30 g via ORAL
  Filled 2017-09-01: qty 120
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
## 2017-09-01 NOTE — Progress Notes (Signed)
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 no pain or tenderness, will try sips of clears today Diastatic SI and pubic symphysis: some RLE soreness.  CC time: 32min    LOS: 2 days    Berna BueChelsea A Laurence Folz 09/01/2017

## 2017-09-01 NOTE — Progress Notes (Signed)
Pt has increased WOB and auditory expiratory wheezing with RR 22-24, SpO2 96%. HR 118. MD notified. Will continue to monitor. Pt also wears CPAP at home, but refuses to wear at hospital.

## 2017-09-01 NOTE — Progress Notes (Signed)
PT Cancellation Note  Patient Details Name: Daniel ChandlerRandy Zamorano MRN: [id number] DOB: 12/27/1955   Cancelled Treatment:    Reason Eval/Treat Not Completed: Pain limiting ability to participate   Per RN, pt recently completed bed level bathing with significant increase in pain. Increased pain medicine requested from MD by her. Attempted to persuade pt to attempt sitting EOB and perhaps OOB, however he politely declined due to pain 10/10 left ribs (at site of chest tube). HR 128 bpm and pt clearly in pain. He agreed to repositioning for comfort and RN assisted with applying ice pack and assisting pt to roll onto his right side.   Will attempt evaluation 09/02/17.    Zena AmosLynn P Giamarie Bueche, PT 09/01/2017, 3:39 PM
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
## 2017-09-02 ENCOUNTER — Inpatient Hospital Stay (HOSPITAL_COMMUNITY): Payer: BC Managed Care – PPO

## 2017-09-02 LAB — TYPE AND SCREEN
ABO/RH(D): A POS
Antibody Screen: NEGATIVE
Unit division: 0
Unit division: 0
Unit division: 0
Unit division: 0
Unit division: 0
Unit division: 0

## 2017-09-02 LAB — BPAM RBC
Blood Product Expiration Date: 201812052359
Blood Product Expiration Date: 201812052359
Blood Product Expiration Date: 201812052359
Blood Product Expiration Date: 201812072359
Blood Product Expiration Date: 201812072359
Blood Product Expiration Date: 201812072359
ISSUE DATE / TIME: 201811231400
ISSUE DATE / TIME: 201811231400
ISSUE DATE / TIME: 201811231717
ISSUE DATE / TIME: 201811232016
ISSUE DATE / TIME: 201811251556
ISSUE DATE / TIME: 201811251812
Unit Type and Rh: 6200
Unit Type and Rh: 6200
Unit Type and Rh: 6200
Unit Type and Rh: 6200
Unit Type and Rh: 9500
Unit Type and Rh: 9500

## 2017-09-02 LAB — BLOOD PRODUCT ORDER (VERBAL) VERIFICATION

## 2017-09-02 LAB — CBC
HCT: 25.9 % — ABNORMAL LOW (ref 39.0–52.0)
Hemoglobin: 8.7 g/dL — ABNORMAL LOW (ref 13.0–17.0)
MCH: 29.1 pg (ref 26.0–34.0)
MCHC: 33.6 g/dL (ref 30.0–36.0)
MCV: 86.6 fL (ref 78.0–100.0)
PLATELETS: 144 10*3/uL — AB (ref 150–400)
RBC: 2.99 MIL/uL — ABNORMAL LOW (ref 4.22–5.81)
RDW: 16.8 % — AB (ref 11.5–15.5)
WBC: 6.5 10*3/uL (ref 4.0–10.5)

## 2017-09-02 LAB — COMPREHENSIVE METABOLIC PANEL
ALBUMIN: 2.5 g/dL — AB (ref 3.5–5.0)
ALT: 70 U/L — ABNORMAL HIGH (ref 17–63)
ANION GAP: 9 (ref 5–15)
AST: 113 U/L — AB (ref 15–41)
Alkaline Phosphatase: 42 U/L (ref 38–126)
BUN: 47 mg/dL — ABNORMAL HIGH (ref 6–20)
CALCIUM: 7.6 mg/dL — AB (ref 8.9–10.3)
CO2: 27 mmol/L (ref 22–32)
Chloride: 103 mmol/L (ref 101–111)
Creatinine, Ser: 3.91 mg/dL — ABNORMAL HIGH (ref 0.61–1.24)
GFR calc Af Amer: 18 mL/min — ABNORMAL LOW (ref >60)
GFR, EST NON AFRICAN AMERICAN: 15 mL/min — AB (ref >60)
GLUCOSE: 143 mg/dL — AB (ref 65–99)
Potassium: 3.9 mmol/L (ref 3.5–5.1)
SODIUM: 139 mmol/L (ref 135–145)
TOTAL PROTEIN: 4.8 g/dL — AB (ref 6.5–8.1)
Total Bilirubin: 1.4 mg/dL — ABNORMAL HIGH (ref 0.3–1.2)

## 2017-09-02 LAB — MAGNESIUM: Magnesium: 1.6 mg/dL — ABNORMAL LOW (ref 1.7–2.4)

## 2017-09-02 MED ORDER — HYDROMORPHONE 1 MG/ML IV SOLN
INTRAVENOUS | Status: DC
  Administered 2017-09-02: 3.8 mg via INTRAVENOUS
  Administered 2017-09-02: 2.8 mg via INTRAVENOUS
  Administered 2017-09-02: 13:00:00 via INTRAVENOUS
  Administered 2017-09-03: 4.2 mg via INTRAVENOUS
  Administered 2017-09-03: 1.5 mg via INTRAVENOUS
  Administered 2017-09-03: 4.8 mg via INTRAVENOUS
  Administered 2017-09-03: 14:00:00 via INTRAVENOUS
  Administered 2017-09-03: 4.2 mg via INTRAVENOUS
  Administered 2017-09-03: 3 mg via INTRAVENOUS
  Administered 2017-09-03: 3.3 mg via INTRAVENOUS
  Administered 2017-09-04: 5.4 mg via INTRAVENOUS
  Administered 2017-09-04: 16:00:00 via INTRAVENOUS
  Administered 2017-09-04: 5 mg via INTRAVENOUS
  Administered 2017-09-04: 4.5 mg via INTRAVENOUS
  Administered 2017-09-04: 3 mg via INTRAVENOUS
  Administered 2017-09-05: 5 mg via INTRAVENOUS
  Administered 2017-09-05: 2.1 mg via INTRAVENOUS
  Administered 2017-09-05: 1.5 mg via INTRAVENOUS
  Filled 2017-09-02 (×2): qty 25

## 2017-09-02 MED ORDER — FUROSEMIDE 10 MG/ML IJ SOLN
40.0000 mg | Freq: Once | INTRAMUSCULAR | Status: AC
  Administered 2017-09-02: 40 mg via INTRAVENOUS
  Filled 2017-09-02: qty 4

## 2017-09-02 MED ORDER — SODIUM CHLORIDE 0.9% FLUSH: 9.0000 mL | INTRAVENOUS | Status: DC | PRN

## 2017-09-02 MED ORDER — IPRATROPIUM-ALBUTEROL 0.5-2.5 (3) MG/3ML IN SOLN
3.0000 mL | Freq: Four times a day (QID) | RESPIRATORY_TRACT | Status: DC
  Administered 2017-09-02 – 2017-09-06 (×18): 3 mL via RESPIRATORY_TRACT
  Filled 2017-09-02 (×18): qty 3

## 2017-09-02 MED ORDER — NALOXONE HCL 0.4 MG/ML IJ SOLN: 0.4000 mg | INTRAMUSCULAR | Status: DC | PRN

## 2017-09-02 MED ORDER — DIPHENHYDRAMINE HCL 50 MG/ML IJ SOLN
12.5000 mg | Freq: Four times a day (QID) | INTRAMUSCULAR | Status: DC | PRN
  Administered 2017-09-04: 12.5 mg via INTRAVENOUS
  Filled 2017-09-02: qty 1

## 2017-09-02 MED ORDER — SODIUM CHLORIDE 0.9% FLUSH
10.0000 mL | Freq: Two times a day (BID) | INTRAVENOUS | Status: DC
  Administered 2017-09-02 – 2017-09-10 (×16): 10 mL

## 2017-09-02 MED ORDER — ONDANSETRON HCL 4 MG/2ML IJ SOLN: 4.0000 mg | Freq: Four times a day (QID) | INTRAMUSCULAR | Status: DC | PRN

## 2017-09-02 MED ORDER — PROMETHAZINE HCL 25 MG/ML IJ SOLN: 12.5000 mg | Freq: Four times a day (QID) | INTRAMUSCULAR | Status: DC | PRN

## 2017-09-02 MED ORDER — DIPHENHYDRAMINE HCL 12.5 MG/5ML PO ELIX
12.5000 mg | ORAL_SOLUTION | Freq: Four times a day (QID) | ORAL | Status: DC | PRN
Start: 2017-09-02 — End: 2017-09-06
  Administered 2017-09-05 – 2017-09-06 (×2): 12.5 mg via ORAL
  Filled 2017-09-02 (×2): qty 5

## 2017-09-02 MED ORDER — ACETAMINOPHEN 10 MG/ML IV SOLN
1000.0000 mg | Freq: Four times a day (QID) | INTRAVENOUS | Status: AC
  Administered 2017-09-02 – 2017-09-03 (×4): 1000 mg via INTRAVENOUS
  Filled 2017-09-02 (×5): qty 100

## 2017-09-02 MED ORDER — SODIUM CHLORIDE 0.9% FLUSH: 10.0000 mL | INTRAVENOUS | Status: DC | PRN

## 2017-09-02 MED ORDER — MAGNESIUM SULFATE 2 GM/50ML IV SOLN
2.0000 g | Freq: Once | INTRAVENOUS | Status: AC
  Administered 2017-09-02: 2 g via INTRAVENOUS
  Filled 2017-09-02: qty 50

## 2017-09-02 NOTE — Progress Notes (Signed)
PT Cancellation Note  Patient Details Name: Daniel ChandlerRandy Patton MRN: [id number] DOB: 07/13/1956   Cancelled Treatment:    Reason Eval/Treat Not Completed: Medical issues which prohibited therapy(pt actively vomiting and awaiting pain control. Will hold per RN)   Enedina FinnerMaija B Quashaun Lazalde 09/02/2017, 10:11 AM  Delaney MeigsMaija Tabor [place] Youngblood, PT [phone]
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
## 2017-09-02 NOTE — Progress Notes (Signed)
Assessed patient for Midline placement. During assessment process patient began to vomit. Notified patient's nurse that VAST would return to place midline after pt was cleaned up and settled. Nurse/patient VU. Tomasita MorrowHeather Erendira Crabtree, RN VAST
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
## 2017-09-03 ENCOUNTER — Inpatient Hospital Stay (HOSPITAL_COMMUNITY): Payer: BC Managed Care – PPO

## 2017-09-03 ENCOUNTER — Encounter (HOSPITAL_COMMUNITY): Payer: Self-pay | Admitting: Physical Medicine & Rehabilitation

## 2017-09-03 DIAGNOSIS — T07XXXA Unspecified multiple injuries, initial encounter: Secondary | ICD-10-CM

## 2017-09-03 DIAGNOSIS — S060X0S Concussion without loss of consciousness, sequela: Secondary | ICD-10-CM

## 2017-09-03 LAB — CBC
HEMATOCRIT: 25.5 % — AB (ref 39.0–52.0)
HEMOGLOBIN: 8.6 g/dL — AB (ref 13.0–17.0)
MCH: 29.6 pg (ref 26.0–34.0)
MCHC: 33.7 g/dL (ref 30.0–36.0)
MCV: 87.6 fL (ref 78.0–100.0)
PLATELETS: 166 10*3/uL (ref 150–400)
RBC: 2.91 MIL/uL — AB (ref 4.22–5.81)
RDW: 16.2 % — ABNORMAL HIGH (ref 11.5–15.5)
WBC: 7.4 10*3/uL (ref 4.0–10.5)

## 2017-09-03 LAB — BASIC METABOLIC PANEL
ANION GAP: 10 (ref 5–15)
BUN: 50 mg/dL — ABNORMAL HIGH (ref 6–20)
CALCIUM: 7.7 mg/dL — AB (ref 8.9–10.3)
CO2: 32 mmol/L (ref 22–32)
Chloride: 99 mmol/L — ABNORMAL LOW (ref 101–111)
Creatinine, Ser: 3.71 mg/dL — ABNORMAL HIGH (ref 0.61–1.24)
GFR calc non Af Amer: 16 mL/min — ABNORMAL LOW (ref >60)
GFR, EST AFRICAN AMERICAN: 19 mL/min — AB (ref >60)
Glucose, Bld: 122 mg/dL — ABNORMAL HIGH (ref 65–99)
POTASSIUM: 3.7 mmol/L (ref 3.5–5.1)
Sodium: 141 mmol/L (ref 135–145)

## 2017-09-03 LAB — MAGNESIUM: Magnesium: 2.1 mg/dL (ref 1.7–2.4)

## 2017-09-03 NOTE — Progress Notes (Signed)
Inpatient Rehabilitation  Per PT request, patient was screened by Demorio Seeley for appropriateness for an Inpatient Acute Rehab consult.  At this time we are recommending an Inpatient Rehab consult.  Text paged medical team to notify; please order if you are agreeable.    Brodan Grewell, M.A., CCC/SLP Admission Coordinator  [facility]  Cell [phone]  
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
## 2017-09-04 ENCOUNTER — Inpatient Hospital Stay (HOSPITAL_COMMUNITY): Payer: BC Managed Care – PPO

## 2017-09-04 LAB — BASIC METABOLIC PANEL
Anion gap: 8 (ref 5–15)
BUN: 44 mg/dL — ABNORMAL HIGH (ref 6–20)
CO2: 30 mmol/L (ref 22–32)
Calcium: 8.2 mg/dL — ABNORMAL LOW (ref 8.9–10.3)
Chloride: 107 mmol/L (ref 101–111)
Creatinine, Ser: 2.8 mg/dL — ABNORMAL HIGH (ref 0.61–1.24)
GFR calc Af Amer: 26 mL/min — ABNORMAL LOW (ref >60)
GFR calc non Af Amer: 23 mL/min — ABNORMAL LOW (ref >60)
Glucose, Bld: 97 mg/dL (ref 65–99)
Potassium: 3.8 mmol/L (ref 3.5–5.1)
Sodium: 145 mmol/L (ref 135–145)

## 2017-09-04 MED ORDER — SODIUM CHLORIDE 0.45 % IV SOLN
INTRAVENOUS | Status: DC
  Administered 2017-09-04: 13:00:00 via INTRAVENOUS
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
## 2017-09-04 NOTE — Progress Notes (Signed)
Inpatient Rehabilitation  Met with patient to discuss team's recommendation for IP Rehab.  Shared booklets, insurance verification letter, and answered initial questions.  Plan to follow for timing of medical readiness, insurance authorization, and IP Rehab bed availability.  Call if questions.  Melissa Bowie, M.A., CCC/SLP Admission Coordinator  [facility]  Cell [phone]  
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
## 2017-09-04 NOTE — Progress Notes (Signed)
Patient refused CPAP at this time. RT informed patient to have RT called if he changes his mind. RT will monitor as needed.

## 2017-09-05 LAB — CBC WITH DIFFERENTIAL/PLATELET
Basophils Absolute: 0.1 10*3/uL (ref 0.0–0.1)
Basophils Relative: 1 %
Eosinophils Absolute: 0.8 10*3/uL — ABNORMAL HIGH (ref 0.0–0.7)
Eosinophils Relative: 8 %
HEMATOCRIT: 26.1 % — AB (ref 39.0–52.0)
HEMOGLOBIN: 8.6 g/dL — AB (ref 13.0–17.0)
LYMPHS ABS: 1.5 10*3/uL (ref 0.7–4.0)
LYMPHS PCT: 15 %
MCH: 29.6 pg (ref 26.0–34.0)
MCHC: 33 g/dL (ref 30.0–36.0)
MCV: 89.7 fL (ref 78.0–100.0)
MONO ABS: 1.3 10*3/uL — AB (ref 0.1–1.0)
MONOS PCT: 13 %
NEUTROS ABS: 6.5 10*3/uL (ref 1.7–7.7)
NEUTROS PCT: 65 %
Platelets: 208 10*3/uL (ref 150–400)
RBC: 2.91 MIL/uL — ABNORMAL LOW (ref 4.22–5.81)
RDW: 15.7 % — ABNORMAL HIGH (ref 11.5–15.5)
WBC: 10 10*3/uL (ref 4.0–10.5)

## 2017-09-05 LAB — BASIC METABOLIC PANEL
ANION GAP: 7 (ref 5–15)
BUN: 39 mg/dL — ABNORMAL HIGH (ref 6–20)
CHLORIDE: 101 mmol/L (ref 101–111)
CO2: 30 mmol/L (ref 22–32)
Calcium: 8.1 mg/dL — ABNORMAL LOW (ref 8.9–10.3)
Creatinine, Ser: 2.43 mg/dL — ABNORMAL HIGH (ref 0.61–1.24)
GFR calc non Af Amer: 27 mL/min — ABNORMAL LOW (ref >60)
GFR, EST AFRICAN AMERICAN: 31 mL/min — AB (ref >60)
GLUCOSE: 100 mg/dL — AB (ref 65–99)
Potassium: 3.3 mmol/L — ABNORMAL LOW (ref 3.5–5.1)
Sodium: 138 mmol/L (ref 135–145)

## 2017-09-05 MED ORDER — HYDROMORPHONE HCL 1 MG/ML IJ SOLN
1.0000 mg | INTRAMUSCULAR | Status: DC | PRN
Start: 2017-09-05 — End: 2017-09-10
  Administered 2017-09-05 – 2017-09-07 (×3): 1 mg via INTRAVENOUS
  Filled 2017-09-05 (×3): qty 1

## 2017-09-05 MED ORDER — OXYCODONE HCL 5 MG PO TABS
5.0000 mg | ORAL_TABLET | ORAL | Status: DC | PRN
  Administered 2017-09-05: 10 mg via ORAL
  Administered 2017-09-05 – 2017-09-06 (×7): 15 mg via ORAL
  Administered 2017-09-07 (×2): 10 mg via ORAL
  Administered 2017-09-07: 15 mg via ORAL
  Administered 2017-09-07 – 2017-09-10 (×11): 10 mg via ORAL
  Filled 2017-09-05 (×2): qty 2
  Filled 2017-09-05: qty 3
  Filled 2017-09-05 (×2): qty 2
  Filled 2017-09-05 (×4): qty 3
  Filled 2017-09-05: qty 2
  Filled 2017-09-05: qty 3
  Filled 2017-09-05 (×2): qty 2
  Filled 2017-09-05: qty 3
  Filled 2017-09-05 (×6): qty 2
  Filled 2017-09-05: qty 3
  Filled 2017-09-05: qty 2

## 2017-09-05 NOTE — Progress Notes (Signed)
Patient ID: Daniel Patton, male   DOB: 08/18/1956, 61 y.o.   MRN: [id number] Patient doing well with improved back  Pain.  No lower extremity symptoms  Strength 5/5  upright lateral lumbar when patient able to be upright.

## 2017-09-05 NOTE — Progress Notes (Signed)
Patient doesn't want to wear hospital provided CPAP at this time because he wears nasal pillows and that apparatus isn't available by the hospital. Patient stated that his wife will bring his mask and CPAP.  RN is aware

## 2017-09-05 NOTE — Progress Notes (Signed)
Inpatient Rehabilitation  I met with patient and spouse at bedside yesterday afternoon to discuss team's recommendation for IP Rehab.  Reviewed booklets and insurance verification letter.  Have initiated insurance authorization in hopes of medical readiness (diet toleration, removal of chest tube, and off IV pain meds) potentially tomorrow.  Call if questions.   Carmelia Roller., CCC/SLP Admission Coordinator  [facility]  Cell [phone]
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
## 2017-09-05 NOTE — Progress Notes (Signed)
Paged Dr. Andrey CampanileWilson regarding chest tube output. No new orders at this time.

## 2017-09-05 NOTE — Progress Notes (Signed)
1000 wasted 10 cc of dilaudid PCA with Londell MohKaty Walton, RN
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
## 2017-09-06 ENCOUNTER — Inpatient Hospital Stay (HOSPITAL_COMMUNITY): Payer: BC Managed Care – PPO

## 2017-09-06 DIAGNOSIS — M7989 Other specified soft tissue disorders: Secondary | ICD-10-CM

## 2017-09-06 LAB — CBC
HCT: 26.2 % — ABNORMAL LOW (ref 39.0–52.0)
Hemoglobin: 8.6 g/dL — ABNORMAL LOW (ref 13.0–17.0)
MCH: 29 pg (ref 26.0–34.0)
MCHC: 32.8 g/dL (ref 30.0–36.0)
MCV: 88.2 fL (ref 78.0–100.0)
PLATELETS: 242 10*3/uL (ref 150–400)
RBC: 2.97 MIL/uL — AB (ref 4.22–5.81)
RDW: 15.1 % (ref 11.5–15.5)
WBC: 16.5 10*3/uL — AB (ref 4.0–10.5)

## 2017-09-06 LAB — BASIC METABOLIC PANEL
ANION GAP: 7 (ref 5–15)
BUN: 36 mg/dL — ABNORMAL HIGH (ref 6–20)
CALCIUM: 7.8 mg/dL — AB (ref 8.9–10.3)
CO2: 28 mmol/L (ref 22–32)
Chloride: 96 mmol/L — ABNORMAL LOW (ref 101–111)
Creatinine, Ser: 2.34 mg/dL — ABNORMAL HIGH (ref 0.61–1.24)
GFR, EST AFRICAN AMERICAN: 33 mL/min — AB (ref >60)
GFR, EST NON AFRICAN AMERICAN: 28 mL/min — AB (ref >60)
Glucose, Bld: 123 mg/dL — ABNORMAL HIGH (ref 65–99)
Potassium: 3.1 mmol/L — ABNORMAL LOW (ref 3.5–5.1)
SODIUM: 131 mmol/L — AB (ref 135–145)

## 2017-09-06 MED ORDER — IPRATROPIUM-ALBUTEROL 0.5-2.5 (3) MG/3ML IN SOLN
3.0000 mL | Freq: Three times a day (TID) | RESPIRATORY_TRACT | Status: DC
  Administered 2017-09-07 – 2017-09-09 (×6): 3 mL via RESPIRATORY_TRACT
  Filled 2017-09-06 (×5): qty 3
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
## 2017-09-06 NOTE — Clinical Social Work Note (Signed)
Clinical Social Worker continuing to follow patient and family for support and discharge planning needs.  Patient received insurance authorization for inpatient rehab, however is not yet medically stable.  Current plan is for patient to be reassessed on Monday for possible inpatient rehab admission.  Clinical Social Worker will sign off for now as social work intervention is no longer needed. Please consult us again if new need arises.  Macario GoldsJesse Zelie Asbill, KentuckyLCSW [phone]
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
## 2017-09-06 NOTE — Progress Notes (Signed)
NEUROSURGERY PROGRESS NOTE  Doing well. Complains of appropriate back soreness. No numbness, tingling o Patient sitting up in chair, doing well.   Temp:  [98.1 F (36.7 C)-98.5 F (36.9 C)] 98.3 F (36.8 C) (11/30 1528) Pulse Rate:  [85-109] 92 (11/30 1300) Resp:  [20-31] 26 (11/30 1300) BP: (123-173)/(66-118) 159/95 (11/30 1300) SpO2:  [91 %-100 %] 97 % (11/30 1427)  Plan: Has not had upright xrays yet. Doing well. No new nsgy interventions.  Sherryl MangesKimberly Hannah Evarose Altland, NP 09/06/2017 5:49 PM

## 2017-09-06 NOTE — Progress Notes (Signed)
Left lower extremity venous duplex has been completed. Negative for DVT. Results were given to the patient's nurse.  09/06/17 11:18 AM Olen CordialGreg Meilin Brosh RVT
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
## 2017-09-06 NOTE — Progress Notes (Signed)
Inpatient Rehabilitation  I have received insurance authorization for an IP Rehab admission and notified [employer] that patient is not medically ready today.  Plan to continue to follow for timing of medical readiness, hopeful for potential admission Monday.  [employer] has requested updates Monday to ensure carryover of authorization.  Plan to follow up with the team Monday.  Notified spouse.  Call if questions.    Charlane FerrettiMelissa Lorene Samaan, M.A., CCC/SLP Admission Coordinator  [facility]  Cell [phone]

## 2017-09-07 ENCOUNTER — Encounter (HOSPITAL_COMMUNITY): Payer: Self-pay

## 2017-09-07 ENCOUNTER — Other Ambulatory Visit: Payer: Self-pay

## 2017-09-07 ENCOUNTER — Inpatient Hospital Stay (HOSPITAL_COMMUNITY): Payer: BC Managed Care – PPO

## 2017-09-07 LAB — COMPREHENSIVE METABOLIC PANEL WITH GFR
ALT: 52 U/L (ref 17–63)
AST: 40 U/L (ref 15–41)
Albumin: 2.2 g/dL — ABNORMAL LOW (ref 3.5–5.0)
Alkaline Phosphatase: 87 U/L (ref 38–126)
Anion gap: 9 (ref 5–15)
BUN: 33 mg/dL — ABNORMAL HIGH (ref 6–20)
CO2: 26 mmol/L (ref 22–32)
Calcium: 8.2 mg/dL — ABNORMAL LOW (ref 8.9–10.3)
Chloride: 96 mmol/L — ABNORMAL LOW (ref 101–111)
Creatinine, Ser: 2.38 mg/dL — ABNORMAL HIGH (ref 0.61–1.24)
GFR calc Af Amer: 32 mL/min — ABNORMAL LOW
GFR calc non Af Amer: 28 mL/min — ABNORMAL LOW
Glucose, Bld: 99 mg/dL (ref 65–99)
Potassium: 3.3 mmol/L — ABNORMAL LOW (ref 3.5–5.1)
Sodium: 131 mmol/L — ABNORMAL LOW (ref 135–145)
Total Bilirubin: 1.8 mg/dL — ABNORMAL HIGH (ref 0.3–1.2)
Total Protein: 5.2 g/dL — ABNORMAL LOW (ref 6.5–8.1)

## 2017-09-07 LAB — EXPECTORATED SPUTUM ASSESSMENT W GRAM STAIN, RFLX TO RESP C: Special Requests: NORMAL

## 2017-09-07 LAB — CBC
HCT: 26.4 % — ABNORMAL LOW (ref 39.0–52.0)
Hemoglobin: 8.6 g/dL — ABNORMAL LOW (ref 13.0–17.0)
MCH: 28.5 pg (ref 26.0–34.0)
MCHC: 32.6 g/dL (ref 30.0–36.0)
MCV: 87.4 fL (ref 78.0–100.0)
Platelets: 269 K/uL (ref 150–400)
RBC: 3.02 MIL/uL — ABNORMAL LOW (ref 4.22–5.81)
RDW: 14.8 % (ref 11.5–15.5)
WBC: 22.2 K/uL — ABNORMAL HIGH (ref 4.0–10.5)

## 2017-09-07 LAB — EXPECTORATED SPUTUM ASSESSMENT W REFEX TO RESP CULTURE

## 2017-09-07 MED ORDER — POTASSIUM CHLORIDE CRYS ER 20 MEQ PO TBCR
40.0000 meq | EXTENDED_RELEASE_TABLET | Freq: Once | ORAL | Status: AC
  Administered 2017-09-07: 40 meq via ORAL
  Filled 2017-09-07: qty 2

## 2017-09-07 MED ORDER — METHOCARBAMOL 500 MG PO TABS
500.0000 mg | ORAL_TABLET | Freq: Four times a day (QID) | ORAL | Status: DC
  Administered 2017-09-07 – 2017-09-10 (×12): 500 mg via ORAL
  Filled 2017-09-07 (×12): qty 1

## 2017-09-07 NOTE — Progress Notes (Signed)
Patient arrived to 4NP04 from 4NICU via w/c and staff. Wife at bedside with belongings.  Patient oriented to room/unit. Vitals stable, telemetry verified.  Patient set up to eat lunch with safety measures in place. Continue to monitor patient.
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
## 2017-09-07 NOTE — Progress Notes (Signed)
Patient ID: Daniel ChandlerRandy Firman, male   DOB: 12/08/1955, [age] y.o.   MRN: [id number] Subjective: The patient is alert and pleasant.  Objective: Vital signs in last 24 hours: Temp:  [98.3 F (36.8 C)-99 F (37.2 C)] 99 F (37.2 C) (12/01 0400) Pulse Rate:  [80-105] 96 (12/01 0700) Resp:  [19-30] 26 (12/01 0700) BP: (119-159)/(68-106) 140/68 (12/01 0700) SpO2:  [88 %-100 %] 95 % (12/01 0755) FiO2 (%):  [36 %] 36 % (12/01 0755)  Intake/Output from previous day: 11/30 0701 - 12/01 0700 In: 1780 [P.O.:1560; IV Piggyback:220] Out: 2990 [Urine:2850; Chest Tube:140] Intake/Output this shift: Total I/O In: -  Out: 275 [Urine:275]  Physical exam the patient is alert and oriented.  He is moving his lower extremities well.  Lab Results: Recent Labs    09/06/17 0426 09/07/17 0426  WBC 16.5* 22.2*  HGB 8.6* 8.6*  HCT 26.2* 26.4*  PLT 242 269   BMET Recent Labs    09/06/17 0426 09/07/17 0426  NA 131* 131*  K 3.1* 3.3*  CL 96* 96*  CO2 28 26  GLUCOSE 123* 99  BUN 36* 33*  CREATININE 2.34* 2.38*  CALCIUM 7.8* 8.2*    Studies/Results: Dg Chest Port 1 View  Result Date: 09/07/2017 CLINICAL DATA:  Pneumothorax. EXAM: PORTABLE CHEST 1 VIEW COMPARISON:  09/06/2017. FINDINGS: Normal cardiomediastinal silhouette. Pigtail catheter LEFT chest remains unchanged. Small LEFT apical pneumothorax, less than 5%. BILATERAL pulmonary opacities appear worse, with decreased lung volumes as well. Increasing consolidation is noted particularly in the RIGHT upper lobe. Dense retrocardiac region persists. Small LEFT effusion. IMPRESSION: Worsening aeration. Pigtail catheter in good position. Small LEFT apical pneumothorax is stable, less than 5%. Electronically Signed   By: Elsie StainJohn T Curnes M.D.   On: 09/07/2017 07:14   Dg Chest Port 1 View  Result Date: 09/06/2017 CLINICAL DATA:  Left pneumothorax EXAM: PORTABLE CHEST 1 VIEW COMPARISON:  09/04/2017 FINDINGS: Left-sided chest tube in unchanged position. Trace  left apical pneumothorax measuring less than 10%. Interval removal of the nasogastric tube. Bilateral diffuse interstitial thickening. Trace left pleural effusion. No right pleural effusion. Stable cardiomediastinal silhouette. Left lower rib fractures again noted. IMPRESSION: 1. Left-sided chest tube in unchanged position. Trace left apical pneumothorax measuring less than 10%. Electronically Signed   By: Elige KoHetal  Patel   On: 09/06/2017 09:33    Assessment/Plan: Lumbar fracture: We are awaiting a standing AP and lateral lumbar x-ray with the patient and his TLSO.  Otherwise, no new recommendations.  LOS: 8 days     Cristi LoronJeffrey D Deetya Drouillard 09/07/2017, 8:53 AM

## 2017-09-07 NOTE — Progress Notes (Signed)
Pt states he does not want to wear his CPAP tonight. Pt has home unit in room

## 2017-09-08 ENCOUNTER — Inpatient Hospital Stay (HOSPITAL_COMMUNITY): Payer: BC Managed Care – PPO

## 2017-09-08 LAB — CBC
HCT: 26.4 % — ABNORMAL LOW (ref 39.0–52.0)
Hemoglobin: 8.7 g/dL — ABNORMAL LOW (ref 13.0–17.0)
MCH: 28.8 pg (ref 26.0–34.0)
MCHC: 33 g/dL (ref 30.0–36.0)
MCV: 87.4 fL (ref 78.0–100.0)
PLATELETS: 321 10*3/uL (ref 150–400)
RBC: 3.02 MIL/uL — AB (ref 4.22–5.81)
RDW: 14.9 % (ref 11.5–15.5)
WBC: 18.3 10*3/uL — ABNORMAL HIGH (ref 4.0–10.5)
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
## 2017-09-08 NOTE — Progress Notes (Signed)
This note also relates to the following rows which could not be included: Pulse Rate - Cannot attach notes to unvalidated device data Resp - Cannot attach notes to unvalidated device data SpO2 - Cannot attach notes to unvalidated device data  Ptb has refused cpap at this time.  Rt will continue to monitor.

## 2017-09-08 NOTE — Progress Notes (Signed)
2050- RN found chest tube removed from pt. Pt in no distress. Sats 96% on 1.5L. Pt stated he was not SOB. Applied petroleum dressing and gauze over site.  Paged on call who ordered STAT chest xray and to call when results posted. Called MD back after results were posted. MD reviewed results with no new orders. Will continue to monitor and report to oncoming nurse in the morning to inform rounding MD.

## 2017-09-09 ENCOUNTER — Inpatient Hospital Stay (HOSPITAL_COMMUNITY): Payer: BC Managed Care – PPO

## 2017-09-09 LAB — URINE CULTURE: SPECIAL REQUESTS: NORMAL

## 2017-09-09 MED ORDER — AMPICILLIN 500 MG PO CAPS
500.0000 mg | ORAL_CAPSULE | Freq: Two times a day (BID) | ORAL | Status: DC
  Administered 2017-09-09 – 2017-09-10 (×3): 500 mg via ORAL
  Filled 2017-09-09 (×5): qty 1

## 2017-09-09 MED ORDER — IPRATROPIUM-ALBUTEROL 0.5-2.5 (3) MG/3ML IN SOLN: 3.0000 mL | RESPIRATORY_TRACT | Status: DC | PRN

## 2017-09-09 MED ORDER — ENOXAPARIN SODIUM 40 MG/0.4ML ~~LOC~~ SOLN
40.0000 mg | SUBCUTANEOUS | Status: DC
  Administered 2017-09-09 – 2017-09-10 (×2): 40 mg via SUBCUTANEOUS
  Filled 2017-09-09 (×2): qty 0.4

## 2017-09-09 MED ORDER — AMPICILLIN 250 MG/5ML PO SUSR
250.0000 mg | Freq: Four times a day (QID) | ORAL | Status: DC
  Filled 2017-09-09: qty 5

## 2017-09-09 MED ORDER — POLYETHYLENE GLYCOL 3350 17 G PO PACK
17.0000 g | PACK | Freq: Every day | ORAL | Status: DC | PRN
  Administered 2017-09-09 – 2017-09-10 (×2): 17 g via ORAL
  Filled 2017-09-09 (×2): qty 1

## 2017-09-09 MED ORDER — AMPICILLIN 250 MG PO CAPS
250.0000 mg | ORAL_CAPSULE | Freq: Four times a day (QID) | ORAL | Status: DC
  Filled 2017-09-09: qty 1

## 2017-09-09 MED ORDER — DOCUSATE SODIUM 100 MG PO CAPS
100.0000 mg | ORAL_CAPSULE | Freq: Two times a day (BID) | ORAL | Status: DC | PRN
  Administered 2017-09-09 – 2017-09-10 (×2): 100 mg via ORAL
  Filled 2017-09-09 (×2): qty 1

## 2017-09-09 NOTE — Progress Notes (Signed)
Inpatient Rehabilitation  Discuss updated medical status with acute medical team; patient may be medically ready tomorrow.  I have updated [employer] and will continue to have authorization tomorrow.  Plan to follow up with team 09/10/17.  Call if questions.   Charlane FerrettiMelissa Gopal Malter, M.A., CCC/SLP Admission Coordinator  [facility]  Cell [phone]
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
## 2017-09-09 NOTE — Progress Notes (Signed)
This note also relates to the following rows which could not be included: Pulse Rate - Cannot attach notes to unvalidated device data SpO2 - Cannot attach notes to unvalidated device data  Pt has home unit and places self on/off.  RT will monitor

## 2017-09-09 NOTE — Progress Notes (Signed)
Patient ID: Daniel ChandlerRandy Patton, male   DOB: 06/08/1956, 61 y.o.   MRN: [id number] Overall patient seems to be doing well however does appear to be medicated a little bit difficult to carry on a conversation with but denies any leg pain denies any numbness and tingling his legs just feels like his left leg is swollen and tight   Strength 5 out of 5 lower extremities iliopsoas, quads, hip she's, gastric, into tibialis, and EHL. Sensation grossly intact  Operative x-rays do show a 5 mm listhesis however canal still seems to be capacious at that level I think the decision point at this point is to see how the patient mobilizes and whether his pain improves with mobilization. If he is having increased back pain and/or any leg pain with mobilization then he will probably need to be stabilized. I think there is a chance that he could fuse this with bracing alone however we would have see evidence of his back pain improving. We'll continue to observe throughout the day and tomorrow and try to make a determination. Patient did apparently have venous duplex couple days ago that showed no evidence of a DVT.

## 2017-09-10 ENCOUNTER — Encounter (HOSPITAL_COMMUNITY): Payer: Self-pay | Admitting: *Deleted

## 2017-09-10 ENCOUNTER — Inpatient Hospital Stay (HOSPITAL_COMMUNITY)
Admission: RE | Admit: 2017-09-10 | Payer: BC Managed Care – PPO | Source: Intra-hospital | Admitting: Physical Medicine & Rehabilitation

## 2017-09-10 ENCOUNTER — Encounter (HOSPITAL_COMMUNITY): Payer: Self-pay | Admitting: Internal Medicine

## 2017-09-10 ENCOUNTER — Inpatient Hospital Stay (HOSPITAL_COMMUNITY)
Admission: RE | Admit: 2017-09-10 | Discharge: 2017-09-20 | DRG: 516 | Disposition: A | Discharge status: Home Health | Payer: BC Managed Care – PPO | Source: Intra-hospital | Attending: Physical Medicine & Rehabilitation | Admitting: Physical Medicine & Rehabilitation

## 2017-09-10 DIAGNOSIS — Z79899 Other long term (current) drug therapy: Secondary | ICD-10-CM | POA: Diagnosis not present

## 2017-09-10 DIAGNOSIS — N179 Acute kidney failure, unspecified: Secondary | ICD-10-CM | POA: Diagnosis present

## 2017-09-10 DIAGNOSIS — I129 Hypertensive chronic kidney disease with stage 1 through stage 4 chronic kidney disease, or unspecified chronic kidney disease: Secondary | ICD-10-CM | POA: Diagnosis present

## 2017-09-10 DIAGNOSIS — I82442 Acute embolism and thrombosis of left tibial vein: Secondary | ICD-10-CM | POA: Diagnosis present

## 2017-09-10 DIAGNOSIS — A499 Bacterial infection, unspecified: Secondary | ICD-10-CM

## 2017-09-10 DIAGNOSIS — D735 Infarction of spleen: Secondary | ICD-10-CM | POA: Diagnosis present

## 2017-09-10 DIAGNOSIS — I82492 Acute embolism and thrombosis of other specified deep vein of left lower extremity: Secondary | ICD-10-CM | POA: Diagnosis present

## 2017-09-10 DIAGNOSIS — M7989 Other specified soft tissue disorders: Secondary | ICD-10-CM | POA: Diagnosis not present

## 2017-09-10 DIAGNOSIS — R000 Tachycardia, unspecified: Secondary | ICD-10-CM

## 2017-09-10 DIAGNOSIS — N183 Chronic kidney disease, stage 3 (moderate): Secondary | ICD-10-CM | POA: Diagnosis present

## 2017-09-10 DIAGNOSIS — S36039D Unspecified laceration of spleen, subsequent encounter: Secondary | ICD-10-CM

## 2017-09-10 DIAGNOSIS — I824Y9 Acute embolism and thrombosis of unspecified deep veins of unspecified proximal lower extremity: Secondary | ICD-10-CM

## 2017-09-10 DIAGNOSIS — B9620 Unspecified Escherichia coli [E. coli] as the cause of diseases classified elsewhere: Secondary | ICD-10-CM | POA: Diagnosis present

## 2017-09-10 DIAGNOSIS — Z7982 Long term (current) use of aspirin: Secondary | ICD-10-CM

## 2017-09-10 DIAGNOSIS — D62 Acute posthemorrhagic anemia: Secondary | ICD-10-CM | POA: Diagnosis present

## 2017-09-10 DIAGNOSIS — S32028D Other fracture of second lumbar vertebra, subsequent encounter for fracture with routine healing: Secondary | ICD-10-CM | POA: Diagnosis not present

## 2017-09-10 DIAGNOSIS — N390 Urinary tract infection, site not specified: Secondary | ICD-10-CM | POA: Diagnosis present

## 2017-09-10 DIAGNOSIS — S2242XD Multiple fractures of ribs, left side, subsequent encounter for fracture with routine healing: Secondary | ICD-10-CM

## 2017-09-10 DIAGNOSIS — S32018D Other fracture of first lumbar vertebra, subsequent encounter for fracture with routine healing: Principal | ICD-10-CM

## 2017-09-10 DIAGNOSIS — I824Z2 Acute embolism and thrombosis of unspecified deep veins of left distal lower extremity: Secondary | ICD-10-CM | POA: Diagnosis not present

## 2017-09-10 DIAGNOSIS — S271XXD Traumatic hemothorax, subsequent encounter: Secondary | ICD-10-CM

## 2017-09-10 DIAGNOSIS — I959 Hypotension, unspecified: Secondary | ICD-10-CM | POA: Diagnosis present

## 2017-09-10 DIAGNOSIS — S32009S Unspecified fracture of unspecified lumbar vertebra, sequela: Secondary | ICD-10-CM

## 2017-09-10 DIAGNOSIS — Z882 Allergy status to sulfonamides status: Secondary | ICD-10-CM

## 2017-09-10 DIAGNOSIS — S060X9S Concussion with loss of consciousness of unspecified duration, sequela: Secondary | ICD-10-CM | POA: Diagnosis not present

## 2017-09-10 DIAGNOSIS — S32009A Unspecified fracture of unspecified lumbar vertebra, initial encounter for closed fracture: Secondary | ICD-10-CM | POA: Diagnosis present

## 2017-09-10 DIAGNOSIS — E875 Hyperkalemia: Secondary | ICD-10-CM | POA: Diagnosis present

## 2017-09-10 DIAGNOSIS — I82409 Acute embolism and thrombosis of unspecified deep veins of unspecified lower extremity: Secondary | ICD-10-CM

## 2017-09-10 DIAGNOSIS — M79662 Pain in left lower leg: Secondary | ICD-10-CM | POA: Diagnosis not present

## 2017-09-10 LAB — CREATININE, SERUM
Creatinine, Ser: 2.16 mg/dL — ABNORMAL HIGH (ref 0.61–1.24)
GFR, EST AFRICAN AMERICAN: 36 mL/min — AB (ref >60)
GFR, EST NON AFRICAN AMERICAN: 31 mL/min — AB (ref >60)

## 2017-09-10 LAB — CULTURE, RESPIRATORY W GRAM STAIN
Culture: NORMAL
Special Requests: NORMAL

## 2017-09-10 LAB — CBC
HCT: 27.7 % — ABNORMAL LOW (ref 39.0–52.0)
HEMATOCRIT: 30.9 % — AB (ref 39.0–52.0)
HEMOGLOBIN: 9.9 g/dL — AB (ref 13.0–17.0)
Hemoglobin: 8.8 g/dL — ABNORMAL LOW (ref 13.0–17.0)
MCH: 28.4 pg (ref 26.0–34.0)
MCH: 28.6 pg (ref 26.0–34.0)
MCHC: 31.8 g/dL (ref 30.0–36.0)
MCHC: 32 g/dL (ref 30.0–36.0)
MCV: 89.4 fL (ref 78.0–100.0)
MCV: 89.3 fL (ref 78.0–100.0)
Platelets: 434 10*3/uL — ABNORMAL HIGH (ref 150–400)
Platelets: 449 10*3/uL — ABNORMAL HIGH (ref 150–400)
RBC: 3.1 MIL/uL — ABNORMAL LOW (ref 4.22–5.81)
RBC: 3.46 MIL/uL — ABNORMAL LOW (ref 4.22–5.81)
RDW: 15.4 % (ref 11.5–15.5)
RDW: 15.4 % (ref 11.5–15.5)
WBC: 16 10*3/uL — ABNORMAL HIGH (ref 4.0–10.5)
WBC: 16.3 10*3/uL — AB (ref 4.0–10.5)

## 2017-09-10 LAB — CULTURE, RESPIRATORY

## 2017-09-10 MED ORDER — SORBITOL 70 % SOLN: 30.0000 mL | Freq: Every day | Status: DC | PRN

## 2017-09-10 MED ORDER — ONDANSETRON HCL 4 MG PO TABS: 4.0000 mg | ORAL_TABLET | Freq: Four times a day (QID) | ORAL | Status: DC | PRN

## 2017-09-10 MED ORDER — IPRATROPIUM-ALBUTEROL 0.5-2.5 (3) MG/3ML IN SOLN: 3.0000 mL | RESPIRATORY_TRACT | Status: DC | PRN

## 2017-09-10 MED ORDER — CHLORHEXIDINE GLUCONATE CLOTH 2 % EX PADS
6.0000 | MEDICATED_PAD | Freq: Every day | CUTANEOUS | Status: DC
  Administered 2017-09-10: 6 via TOPICAL

## 2017-09-10 MED ORDER — DOCUSATE SODIUM 100 MG PO CAPS: 100.0000 mg | ORAL_CAPSULE | Freq: Two times a day (BID) | ORAL | Status: DC | PRN

## 2017-09-10 MED ORDER — AMPICILLIN 500 MG PO CAPS
500.0000 mg | ORAL_CAPSULE | Freq: Two times a day (BID) | ORAL | Status: AC
  Administered 2017-09-10 – 2017-09-11 (×3): 500 mg via ORAL
  Filled 2017-09-10 (×3): qty 1

## 2017-09-10 MED ORDER — IPRATROPIUM-ALBUTEROL 0.5-2.5 (3) MG/3ML IN SOLN
3.0000 mL | Freq: Three times a day (TID) | RESPIRATORY_TRACT | Status: DC
  Administered 2017-09-11 – 2017-09-14 (×8): 3 mL via RESPIRATORY_TRACT
  Filled 2017-09-10 (×9): qty 3

## 2017-09-10 MED ORDER — ENOXAPARIN SODIUM 40 MG/0.4ML ~~LOC~~ SOLN
40.0000 mg | SUBCUTANEOUS | Status: DC
  Administered 2017-09-11 – 2017-09-16 (×6): 40 mg via SUBCUTANEOUS
  Filled 2017-09-10 (×6): qty 0.4

## 2017-09-10 MED ORDER — POLYETHYLENE GLYCOL 3350 17 G PO PACK: 17.0000 g | PACK | Freq: Every day | ORAL | Status: DC | PRN

## 2017-09-10 MED ORDER — METHOCARBAMOL 500 MG PO TABS
500.0000 mg | ORAL_TABLET | Freq: Four times a day (QID) | ORAL | Status: DC
  Administered 2017-09-10 – 2017-09-20 (×38): 500 mg via ORAL
  Filled 2017-09-10 (×40): qty 1

## 2017-09-10 MED ORDER — ONDANSETRON HCL 4 MG/2ML IJ SOLN
4.0000 mg | Freq: Four times a day (QID) | INTRAMUSCULAR | Status: DC | PRN
Start: 2017-09-10 — End: 2017-09-20

## 2017-09-10 MED ORDER — PANTOPRAZOLE SODIUM 40 MG PO TBEC
40.0000 mg | DELAYED_RELEASE_TABLET | Freq: Every day | ORAL | Status: DC
  Administered 2017-09-11 – 2017-09-20 (×10): 40 mg via ORAL
  Filled 2017-09-10 (×10): qty 1

## 2017-09-10 MED ORDER — IPRATROPIUM-ALBUTEROL 0.5-2.5 (3) MG/3ML IN SOLN
3.0000 mL | Freq: Four times a day (QID) | RESPIRATORY_TRACT | Status: DC
  Filled 2017-09-10: qty 3

## 2017-09-10 MED ORDER — IPRATROPIUM-ALBUTEROL 0.5-2.5 (3) MG/3ML IN SOLN
3.0000 mL | Freq: Four times a day (QID) | RESPIRATORY_TRACT | Status: DC
  Administered 2017-09-10: 3 mL via RESPIRATORY_TRACT

## 2017-09-10 MED ORDER — ENOXAPARIN SODIUM 40 MG/0.4ML ~~LOC~~ SOLN: 40.0000 mg | SUBCUTANEOUS | Status: DC

## 2017-09-10 MED ORDER — OXYCODONE HCL 5 MG PO TABS
5.0000 mg | ORAL_TABLET | ORAL | Status: DC | PRN
  Administered 2017-09-10 – 2017-09-12 (×6): 10 mg via ORAL
  Administered 2017-09-12: 15 mg via ORAL
  Administered 2017-09-12: 10 mg via ORAL
  Administered 2017-09-13 – 2017-09-15 (×11): 15 mg via ORAL
  Administered 2017-09-15: 10 mg via ORAL
  Administered 2017-09-16: 15 mg via ORAL
  Administered 2017-09-16 (×3): 10 mg via ORAL
  Administered 2017-09-17 (×4): 15 mg via ORAL
  Administered 2017-09-18: 10 mg via ORAL
  Administered 2017-09-18 – 2017-09-19 (×7): 15 mg via ORAL
  Administered 2017-09-20: 10 mg via ORAL
  Administered 2017-09-20: 15 mg via ORAL
  Filled 2017-09-10 (×2): qty 3
  Filled 2017-09-10: qty 1
  Filled 2017-09-10 (×3): qty 2
  Filled 2017-09-10 (×2): qty 3
  Filled 2017-09-10 (×2): qty 2
  Filled 2017-09-10 (×7): qty 3
  Filled 2017-09-10: qty 2
  Filled 2017-09-10: qty 3
  Filled 2017-09-10: qty 2
  Filled 2017-09-10 (×2): qty 3
  Filled 2017-09-10: qty 2
  Filled 2017-09-10 (×3): qty 3
  Filled 2017-09-10: qty 2
  Filled 2017-09-10 (×2): qty 3
  Filled 2017-09-10: qty 2
  Filled 2017-09-10 (×6): qty 3
  Filled 2017-09-10 (×2): qty 2
  Filled 2017-09-10: qty 3
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
## 2017-09-10 NOTE — Progress Notes (Addendum)
Inpatient Rehabilitation  I have received medical clearance, have insurance approval, and a bed to offer patient today.  Plan to proceed with admission.  Updated team.  Call if questions.   Charlane FerrettiMelissa Aleric Froelich, M.A., CCC/SLP Admission Coordinator  [facility]  Cell [phone]
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
## 2017-09-10 NOTE — Progress Notes (Signed)
Orthopedic Tech Progress Note Patient Details:  Daniel ChandlerRandy Patton 08/05/1956 [id number] Patient already has brace. Patient ID: Daniel Patton, male   DOB: 01/05/1956, 61 y.o.   MRN: [id number]   Daniel MoccasinHughes, Daniel Patton 09/10/2017, 4:33 PM
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
## 2017-09-10 NOTE — Progress Notes (Signed)
Pt adnmitted to unit at 1615 with wife at bedside. RN educated on rehab process, safety plan and schedule. Call bell within reach and wife at bedside.

## 2017-09-11 ENCOUNTER — Inpatient Hospital Stay (HOSPITAL_COMMUNITY): Payer: BC Managed Care – PPO | Admitting: Speech Pathology

## 2017-09-11 ENCOUNTER — Inpatient Hospital Stay (HOSPITAL_COMMUNITY): Payer: BC Managed Care – PPO

## 2017-09-11 ENCOUNTER — Inpatient Hospital Stay (HOSPITAL_COMMUNITY): Payer: BC Managed Care – PPO | Admitting: Occupational Therapy

## 2017-09-11 DIAGNOSIS — A499 Bacterial infection, unspecified: Secondary | ICD-10-CM

## 2017-09-11 DIAGNOSIS — R000 Tachycardia, unspecified: Secondary | ICD-10-CM

## 2017-09-11 DIAGNOSIS — S060X9S Concussion with loss of consciousness of unspecified duration, sequela: Secondary | ICD-10-CM

## 2017-09-11 DIAGNOSIS — N390 Urinary tract infection, site not specified: Secondary | ICD-10-CM

## 2017-09-11 LAB — COMPREHENSIVE METABOLIC PANEL
ALK PHOS: 105 U/L (ref 38–126)
ALT: 58 U/L (ref 17–63)
AST: 57 U/L — AB (ref 15–41)
Albumin: 2.4 g/dL — ABNORMAL LOW (ref 3.5–5.0)
Anion gap: 13 (ref 5–15)
BUN: 28 mg/dL — AB (ref 6–20)
CHLORIDE: 103 mmol/L (ref 101–111)
CO2: 22 mmol/L (ref 22–32)
CREATININE: 2.43 mg/dL — AB (ref 0.61–1.24)
Calcium: 8.3 mg/dL — ABNORMAL LOW (ref 8.9–10.3)
GFR calc Af Amer: 31 mL/min — ABNORMAL LOW (ref >60)
GFR calc non Af Amer: 27 mL/min — ABNORMAL LOW (ref >60)
GLUCOSE: 132 mg/dL — AB (ref 65–99)
Potassium: 5.8 mmol/L — ABNORMAL HIGH (ref 3.5–5.1)
SODIUM: 138 mmol/L (ref 135–145)
Total Bilirubin: 1.4 mg/dL — ABNORMAL HIGH (ref 0.3–1.2)
Total Protein: 5.9 g/dL — ABNORMAL LOW (ref 6.5–8.1)

## 2017-09-11 LAB — CBC WITH DIFFERENTIAL/PLATELET
Basophils Absolute: 0.1 10*3/uL (ref 0.0–0.1)
Basophils Relative: 0 %
EOS ABS: 0.5 10*3/uL (ref 0.0–0.7)
EOS PCT: 3 %
HCT: 29.8 % — ABNORMAL LOW (ref 39.0–52.0)
HEMOGLOBIN: 9.5 g/dL — AB (ref 13.0–17.0)
LYMPHS ABS: 1.8 10*3/uL (ref 0.7–4.0)
Lymphocytes Relative: 12 %
MCH: 28.6 pg (ref 26.0–34.0)
MCHC: 31.9 g/dL (ref 30.0–36.0)
MCV: 89.8 fL (ref 78.0–100.0)
MONO ABS: 1 10*3/uL (ref 0.1–1.0)
MONOS PCT: 7 %
Neutro Abs: 11.1 10*3/uL — ABNORMAL HIGH (ref 1.7–7.7)
Neutrophils Relative %: 78 %
PLATELETS: 408 10*3/uL — AB (ref 150–400)
RBC: 3.32 MIL/uL — ABNORMAL LOW (ref 4.22–5.81)
RDW: 15.4 % (ref 11.5–15.5)
WBC: 14.3 10*3/uL — ABNORMAL HIGH (ref 4.0–10.5)
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
## 2017-09-11 NOTE — Evaluation (Signed)
Physical Therapy Assessment and Plan  Patient Details  Name: Daniel Patton MRN: [id number] Date of Birth: Apr 09, 1956  PT Diagnosis: Difficulty walking, Edema, Low back pain, Muscle weakness and Pain in joint: spine Rehab Potential: Good ELOS: 10-12   Today's Date: 09/11/2017 PT Individual Time: 2353-6144 PT Individual Time Calculation (min): 75 min    Problem List:  Patient Active Problem List   Diagnosis Date Noted  . Concussion with loss of consciousness, with loc of unspecified duration, sequela ([place]) 09/11/2017  . Sinus tachycardia 09/11/2017  . Bacterial UTI 09/11/2017  . Lumbar transverse process fracture ([place]) 09/10/2017  . Renal artery injury 08/30/2017    Past Medical History: History reviewed. No pertinent past medical history. Past Surgical History:  Past Surgical History:  Procedure Laterality Date  . COLONOSCOPY N/A 04/15/2017   Procedure: COLONOSCOPY;  Surgeon: Daneil Dolin, MD;  Location[facility];  Service: Endoscopy;  Laterality: N/A;  1200    Assessment & Plan Clinical Impression:  Patient transferred to CIR on 09/10/2017 .   Patient currently requires mod with mobility secondary to muscle weakness, decreased cardiorespiratoy endurance and decreased oxygen support and decreased standing balance, decreased postural control, decreased balance strategies and difficulty maintaining precautions.  Prior to hospitalization, patient was independent  with mobility and lived with   in a House home.  Home access is 6Stairs to enter, 2 rails.  Patient will benefit from skilled PT intervention to maximize safe functional mobility, minimize fall risk and decrease caregiver burden for planned discharge home with intermittent assist.  Anticipate patient will benefit from follow up [facility] at discharge.  PT - End of Session Activity Tolerance: Tolerates < 10 min activity with changes in vital signs Endurance Deficit: Yes Endurance Deficit Description: O2 sats dropped to 85%  after gait x 60', on 2L O2 via [place] PT Assessment Rehab Potential (ACUTE/IP ONLY): Good PT Patient demonstrates impairments in the following area(s): Balance;Edema;Endurance;Motor;Pain PT Transfers Functional Problem(s): Bed Mobility;Bed to Chair;Car;Furniture PT Locomotion Functional Problem(s): Stairs;Ambulation;Wheelchair Mobility PT Plan PT Intensity: Minimum of 1-2 x/day ,45 to 90 minutes PT Frequency: 5 out of 7 days PT Duration Estimated Length of Stay: 10-12 PT Treatment/Interventions: Ambulation/gait training;Balance/vestibular training;Discharge planning;Community reintegration;DME/adaptive equipment instruction;Functional mobility training;Patient/family education;Pain management;Neuromuscular re-education;Psychosocial support;Splinting/orthotics;Therapeutic Exercise;Therapeutic Activities;Stair training;UE/LE Strength taining/ROM;UE/LE Coordination activities;Wheelchair propulsion/positioning PT Transfers Anticipated Outcome(s): modified independent basic, supervision car PT Locomotion Anticipated Outcome(s): modified independent w/c propulsion x 150 controlled, supervision community; modified independent gait x 150' controlled, 50' home, supervision gait x 150' community; min assist 10 stairs 1 rail PT Recommendation Follow Up Recommendations: Home health PT Patient destination: Home Equipment Recommended: To be determined  Skilled Therapeutic Intervention:  Pt sitting up in recliner at start of session.  Pt able to state BLT acronym for back precautions, but attempted to twist in sitting to reach an item.Pt on 2L Os via .  In sitting, at rest, BP 102/66, HR 111.  After gait x 60', after 1-2 minutes sitting at rest, O2 sats 85%, rising to >90% in 30 seconds with cues to breathe deeply. Pt participated well and was conversational; he remembered the 3 word test x 10 minutes 3/3.  ELOS and LTGS discussed with pt. Pt left resting in w/c all needs within reach. Falls precautions and use  of call bell reviewed with pt.   PT Evaluation Precautions/Restrictions Precautions Precautions: Fall;Back Precaution Comments: watch O2 Required Braces or Orthoses: Spinal Brace Spinal Brace: Thoracolumbosacral orthotic;Applied in sitting position Restrictions Weight Bearing Restrictions: No General  Vital SignsTherapy Vitals Pulse Rate: (!) 111 BP: 102/66 Patient Position (if appropriate): Sitting Oxygen Therapy SpO2: 93 % O2 Device: Nasal Cannula O2 Flow Rate (L/min): 2 L/min Pain Pain Assessment Pain Assessment: 0-10 Pain Score: 5  Home Living/Prior Functioning Home Living Available Help at Discharge: Family;Available 24 hours/day Type of Home: House Home Access: Stairs to enter [employer] of Steps: 6 Entrance Stairs-Rails: Right Home Layout: Multi-level;Able to live on main level with bedroom/bathroom Alternate Level Stairs-Number of Steps: flight Bathroom Shower/Tub: Tub/shower unit;Curtain;Walk-in shower Bathroom Toilet: Handicapped height Bathroom Accessibility: No Prior Function  Able to Take Stairs?: Yes Driving: Yes 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 Leisure: Hobbies-yes (Comment) Comments: Plays golf and tennis, outdoors person; 4 grandchildren were with him in golf cart; one has 2 broken legs, but is back home Vision/Perception no change from baseline; wears readers    Cognition Overall Cognitive Status: Within Functional Limits for tasks assessed Orientation Level: Oriented X4 Attention: Sustained Sustained Attention: Appears intact Memory: Appears intact Awareness: Appears intact Problem Solving: Appears intact Safety/Judgment: Appears intact Sensation Sensation Light Touch: Appears Intact Proprioception: Appears Intact Coordination Heel Shin Test: L limited excursion and speed due to pain Motor  Motor Motor - Skilled Clinical Observations: trunk and LLE and LUE weakness due to fxs and pain  Mobility Bed  Mobility Bed Mobility: Not assessed Transfers Transfers: Yes Stand Pivot Transfers: With armrests;Other (comment);4: Min assist(with RW) Stand Pivot Transfer Details: Manual facilitation for weight shifting;Verbal cues for precautions/safety;Verbal cues for technique Locomotion  Ambulation Ambulation: Yes Ambulation/Gait Assistance: 4: Min assist Ambulation Distance (Feet): 60 Feet Assistive device: Rolling walker Ambulation/Gait Assistance Details: Manual facilitation for weight shifting Gait Gait: Yes Gait Pattern: Impaired Gait Pattern: Decreased stride length;Decreased hip/knee flexion - left;Decreased weight shift to left;Decreased trunk rotation Gait velocity: decreased Stairs / Additional Locomotion Stairs: No Wheelchair Mobility Wheelchair Mobility: Yes Wheelchair Assistance: 4: Min Lexicographer: Both upper extremities Wheelchair Parts Management: Needs assistance Distance: 50  Trunk/Postural Assessment  Cervical Assessment Cervical Assessment: Within Functional Limits Thoracic Assessment Thoracic Assessment: Exceptions to WFL(TLSO) Lumbar Assessment Lumbar Assessment: Exceptions to WFL(TLSO) Postural Control Postural Control: Deficits on evaluation Protective Responses: delayed and inadequate  Balance Balance Balance Assessed: Yes Static Sitting Balance Static Sitting - Level of Assistance: 5: Stand by assistance Dynamic Sitting Balance Dynamic Sitting - Level of Assistance: 5: Stand by assistance Static Standing Balance Static Standing - Level of Assistance: 4: Min assist Dynamic Standing Balance Dynamic Standing - Level of Assistance: 4: Min assist(during gait with AD) Extremity Assessment      RLE Assessment RLE Assessment: Within Functional Limits(mild LL and foot edema) LLE Assessment LLE Assessment: Exceptions to WFL(mild/moderate edema LL and foot) LLE Strength LLE Overall Strength Comments: at least 3+/5 hip flex (mostly hip ER),  knee ext and 4+/5 ankle DF   See Function Navigator for Current Functional Status.   Refer to Care Plan for Long Term Goals  Recommendations for other services: None   Discharge Criteria: Patient will be discharged from PT if patient refuses treatment 3 consecutive times without medical reason, if treatment goals not met, if there is a change in medical status, if patient makes no progress towards goals or if patient is discharged from hospital.  The above assessment, treatment plan, treatment alternatives and goals were discussed and mutually agreed upon: by patient  Brandan Robicheaux 09/11/2017, 12:36 PM

## 2017-09-11 NOTE — Progress Notes (Signed)
Patient information reviewed and entered into eRehab system by Charmain Diosdado, RN, CRRN, PPS Coordinator.  Information including medical coding and functional independence measure will be reviewed and updated through discharge.    
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
## 2017-09-12 ENCOUNTER — Inpatient Hospital Stay (HOSPITAL_COMMUNITY): Payer: BC Managed Care – PPO

## 2017-09-12 ENCOUNTER — Inpatient Hospital Stay (HOSPITAL_COMMUNITY): Payer: BC Managed Care – PPO | Admitting: Occupational Therapy

## 2017-09-12 DIAGNOSIS — N390 Urinary tract infection, site not specified: Secondary | ICD-10-CM

## 2017-09-12 DIAGNOSIS — A499 Bacterial infection, unspecified: Secondary | ICD-10-CM

## 2017-09-12 DIAGNOSIS — R7989 Other specified abnormal findings of blood chemistry: Secondary | ICD-10-CM

## 2017-09-12 LAB — BASIC METABOLIC PANEL
ANION GAP: 11 (ref 5–15)
BUN: 24 mg/dL — ABNORMAL HIGH (ref 6–20)
CHLORIDE: 103 mmol/L (ref 101–111)
CO2: 25 mmol/L (ref 22–32)
Calcium: 8.3 mg/dL — ABNORMAL LOW (ref 8.9–10.3)
Creatinine, Ser: 2.17 mg/dL — ABNORMAL HIGH (ref 0.61–1.24)
GFR calc Af Amer: 36 mL/min — ABNORMAL LOW (ref >60)
GFR, EST NON AFRICAN AMERICAN: 31 mL/min — AB (ref >60)
GLUCOSE: 107 mg/dL — AB (ref 65–99)
POTASSIUM: 4.3 mmol/L (ref 3.5–5.1)
Sodium: 139 mmol/L (ref 135–145)

## 2017-09-12 MED ORDER — CALCIUM CARBONATE ANTACID 500 MG PO CHEW
1.0000 | CHEWABLE_TABLET | ORAL | Status: DC | PRN
  Administered 2017-09-12: 200 mg via ORAL
  Filled 2017-09-12: qty 1
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
## 2017-09-12 NOTE — Progress Notes (Signed)
Occupational Therapy Session Note  Patient Details  Name: Daniel ChandlerRandy Patton MRN: [id number] Date of Birth: 09/17/1956  Today's Date: 09/12/2017 OT Individual Time: 0900-1000 OT Individual Time Calculation (min): 60 min    Short Term Goals: Week 1:  OT Short Term Goal 1 (Week 1): STGs=LTGs secondary to estimated short LOS  Skilled Therapeutic Interventions/Progress Updates:    1:1 Self care retraining at shower level. Focus on functional ambulation with RW around the room with min guard. Pt's TLSO falling down past pt's bottom (twice in session). Paged Ortho tech for a better fitting consult- awaiting to hear back. Pt able to bathe with min A in seated position on BSC (for better access to peri area). Pt's O2 on 2 liters did dip down to 85% with activity with elevated HR but able to recover within 1 min. Pt stood at sink with min tactile cues for sustained left LE extension while brushing his teeth.  Ambulated with RW to the dayroom and performed 10  Min on level 4 on Nustep for endurance and overall strengthening. Pt left in recliner with feet elevated with RN at end of session.   Therapy Documentation Precautions:  Precautions Precautions: Fall, Back Precaution Comments: watch O2 Required Braces or Orthoses: Spinal Brace Spinal Brace: Thoracolumbosacral orthotic, Applied in sitting position Restrictions Weight Bearing Restrictions: No    Vital Signs: Oxygen Therapy SpO2: 94 % O2 Device: Nasal Cannula O2 Flow Rate (L/min): 2 L/min Pain: Pain Assessment Pain Assessment: 0-10 Pain Score: 6  Pain Type: Acute pain Pain Location: Back Pain Orientation: Lower Pain Descriptors / Indicators: Aching Pain Frequency: Constant Pain Onset: On-going Patients Stated Pain Goal: 4 Pain Intervention(s): Medication (See eMAR)  See Function Navigator for Current Functional Status.   Therapy/Group: Individual Therapy  Roney MansSmith, Yemaya Barnier [facility] 09/12/2017, 10:53 AM

## 2017-09-12 NOTE — Progress Notes (Signed)
Physical Therapy Note  Patient Details  Name: Daniel ChandlerRandy Romberger MRN: [id number] Date of Birth: 11/02/1955 Today's Date: 09/12/2017  1045-1200, 75 min individual tx Pain: none  Jen, OT reported that she had paged [employer] regarding TLSO which seems loose to her.  PT adjusted brace as well as possible.  Pt reported he has a hard time breathling deeply when brace it tight.   Pt reported he had just finished using flutter valve.O2 sats at rest in recliner, on 2L O2 via [place] = 86. Pt's color good and no diaphoresis or tachycardia.  Sats increased to 93% with cues for deep breathing.   W/c switched for one with a firm, padded back; pt stated it felt much more comfortable.   W/c propulsion using bil LEs on level tile; sats dropped to 78%; again pt asymptomatic and slowly increased to >90% .  Neuromuscular re-education via forced use for prolonged L hamstring stretch as pt had spasm after propelling w/c; seated with foot on foot stool.  neuromuscular re-education via demo and multimodal cues for bil hip abduction against resistance of bright green Theraband, x 15 with focus on eccentric control.  O2 sats dropped as above.  O2 increased to 4L for gait up/down (2) 3" high steps, bil rails, step -to method.  Pt paused at 2nd step to breathe.  After seated rest in w/c, gait training with RW on level terrain to return to room. Pt transferred to recliner and left with all needs within reach.  O2 sat at 2L on wall.   See function navigator for current status.  Ted Leonhart 09/12/2017, 8:00 AM

## 2017-09-12 NOTE — Progress Notes (Signed)
Physical Therapy Session Note  Patient Details  Name: Daniel Patton MRN: [id number] Date of Birth: [birth year]  Today's Date: 09/12/2017 PT Individual Time: 5189-8421 PT Individual Time Calculation (min): 45 min   Short Term Goals: Week 1:     Skilled Therapeutic Interventions/Progress Updates:    C/o 5/10 pain at start of session but agreeable to therapy session.  PT re-adjusted TLSO for improved fit/support.  Sit<>stand with supervision throughout session.  Gait x150' with RW and supervision, min verbal cues for relaxing shoulders.  Pt requiring extended seated rest break 2/2 pain.  O2 assessed and >90% on 3L via nasal cannula.  Pain continued at 10/10 level despite rest break, pt beginning to shiver 2/2 pain.  Returned to room in w/c and positioned in recliner, BP assessed and 135/85, HR 105.  Pt positioned with ice to lower back and reports some improvement in pain.  Positioned with call bell in reach and needs met.   Therapy Documentation Precautions:  Precautions Precautions: Fall, Back Precaution Comments: watch O2 Required Braces or Orthoses: Spinal Brace Spinal Brace: Thoracolumbosacral orthotic, Applied in sitting position Restrictions Weight Bearing Restrictions: No   See Function Navigator for Current Functional Status.   Therapy/Group: Individual Therapy  Michel Santee 09/12/2017, 4:47 PM

## 2017-09-12 NOTE — Care Management (Signed)
[facility] Individual Statement of Services  Patient Name:  Daniel ChandlerRandy Dao  Date:  09/12/2017  Welcome to the [facility].  Our goal is to provide you with an individualized program based on your diagnosis and situation, designed to meet your specific needs.  With this comprehensive rehabilitation program, you will be expected to participate in at least 3 hours of rehabilitation therapies Monday-Friday, with modified therapy programming on the weekends.  Your rehabilitation program will include the following services:  Physical Therapy (PT), Occupational Therapy (OT), Speech Therapy (ST), 24 hour per day rehabilitation nursing, Therapeutic Recreaction (TR), Neuropsychology, Case Management (Social Worker), Rehabilitation Medicine, Nutrition Services and Pharmacy Services  Weekly team conferences will be held on Tuesdays to discuss your progress.  Your Social Worker will talk with you frequently to get your input and to update you on team discussions.  Team conferences with you and your family in attendance may also be held.  Expected length of stay: 10-12 days    Overall anticipated outcome: modified independent  Depending on your progress and recovery, your program may change. Your Social Worker will coordinate services and will keep you informed of any changes. Your Social Worker's name and contact numbers are listed  below.  The following services may also be recommended but are not provided by the [facility]:   Driving Evaluations  Home Health Rehabiltiation Services  Outpatient Rehabilitation Services    Arrangements will be made to provide these services after discharge if needed.  Arrangements include referral to agencies that provide these services.  Your insurance has been verified to be:  [employer] Your primary doctor is:  Modesto CharonWong  Pertinent information will be shared with your doctor and your insurance  company.  Social Worker:  [place]Lucy Case [place], [place] [phone] or ([phone]) [phone]   Information discussed with and copy given to patient by: Amada JupiterHOYLE, Ziara Thelander, 09/12/2017, 2:50 PM
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
## 2017-09-13 ENCOUNTER — Inpatient Hospital Stay (HOSPITAL_COMMUNITY): Payer: BC Managed Care – PPO

## 2017-09-13 ENCOUNTER — Inpatient Hospital Stay (HOSPITAL_COMMUNITY): Payer: BC Managed Care – PPO | Admitting: Occupational Therapy

## 2017-09-13 ENCOUNTER — Inpatient Hospital Stay (HOSPITAL_COMMUNITY): Payer: BC Managed Care – PPO | Admitting: Physical Therapy
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
## 2017-09-13 NOTE — Progress Notes (Signed)
Orthopedic Tech Progress Note Patient Details:  Danie ChandlerRandy Pierro 08/04/1956 [id number]  Ortho Devices Type of Ortho Device: Other (comment) Ortho Device/Splint Location: Floor nursed stated pt is stating brace does not fit.  Ortho Tech called [employer] and requested re-fitting of brace.        Alvina ChouWilliams, Naeemah Jasmer C 09/13/2017, 4:35 PM

## 2017-09-13 NOTE — Progress Notes (Signed)
Occupational Therapy Session Note  Patient Details  Name: Daniel Patton MRN: [id number] Date of Birth: 10/02/1956  Today's Date: 09/13/2017 OT Individual Time: [id number] and 1100-1135 OT Individual Time Calculation (min): 64 min and 35 min   Short Term Goals: Week 1:  OT Short Term Goal 1 (Week 1): STGs=LTGs secondary to estimated short LOS  Skilled Therapeutic Interventions/Progress Updates:    Tx focus on functional ambulation with device, pain mgt, adherence to back precautions, and adaptive bathing/dressing skills.   Pt greeted in recliner with TLSO donned. RN present, administering pain medication. Per pt, he reported 8/10 pain. Utilized music therapeutically throughout ADL for additional pain mgt intervention. He ambulated with RW and [facility], OT assisting with 02 tank mgt and providing cues for tube mgt. Pt bathing while seated with cues to remain seated due to attempt at sit<stand. Educated him on using reacher adaptively with wash cloth to reach bilateral feet. He completed dressing on BSC afterwards, with cues for sequencing, reacher use, and managing TLSO. Pt then ambulated to w/c placed at sink and completed shaving with extra time due to fatigue/pain. Total A for donning Teds and sneakers. At end of session pt was left in w/c with all needs within reach and RT present.   02 sats on 2L throughout session 99-100%.  Pain at end of tx 5/10.  2nd Session 1:1 tx (35 min) Tx focus on functional ambulation with device, pain mgt, and 02 weaning during functional tasks.   Pt greeted in w/c, agreeable to walk to family room and make coffee. 02 sats on 2L 100%, decreased supplemental 02 to 1L. First he ambulated with RW (TLSO donned) into bathroom with min guard to void bladder. Assist provided during hygiene for precaution adherence. He then ambulated down hallway without rest, prepared coffee while standing. Cues provided for safe DME mgt and technique of side stepping to  avoid twisting motions. Pt taking seated rest break to drink coffee, reported pain had increased to 6/10. Educated him on visualization strategies he could implement to assist with pain mgt and he was receptive to this and using strategies during tx. Pt then ambulated back to room in manner as written above. 02 sats throughout tx 99-100% on 1L and RN in agreement to keep him on 1L in room. Pt left with all needs and visitor at session exit.   During both sessions, pts brace would slip down to buttocks, even after it was readjusted and tightened prior to sit<stands. RN notified and ortho tech contacted again for ordering/delivering newly fitted brace today.   Therapy Documentation Precautions:  Precautions Precautions: Fall, Back Precaution Comments: watch O2 Required Braces or Orthoses: Spinal Brace Spinal Brace: Thoracolumbosacral orthotic, Applied in sitting position Restrictions Weight Bearing Restrictions: No Vital Signs: Oxygen Therapy SpO2: 98 % O2 Device: Nasal Cannula O2 Flow Rate (L/min): 2 L/min Pain: Addressed as written above  Pain Assessment Pain Assessment: 0-10 Pain Score: 8  Pain Type: Acute pain;Surgical pain Pain Location: Back Pain Orientation: Lower Pain Intervention(s): Medication (See eMAR) ADL:      See Function Navigator for Current Functional Status.  Therapy/Group: Individual Therapy  Yulanda Diggs A Priscilla Kirstein 09/13/2017, 11:52 AM

## 2017-09-13 NOTE — Progress Notes (Signed)
Physical Therapy Session Note  Patient Details  Name: Daniel Patton MRN: [id number] Date of Birth: July 24, 1956  Today's Date: 09/13/2017 PT Individual Time: 1430-1515 PT Individual Time Calculation (min): 45 min        Skilled Therapeutic Interventions/Progress Updates:  Pt received sitting in WC and agreeable to PT  PT transported pt to rehab gym in [facility]. Gait training with RW x 190f and min assist from PT for safety and TLSO positioning.   PT instructed pt in stair management for step up/down 6 inch step x 5 BLE with BUE support. Min cues for step pattern and posture.   WC mobility instructed by PT x 1543fwith supervision assist and min-cues for proper use of BUE to maintain straight path and obstacle navigation.   Pt remained on 1 L/min supplemental O2 throughout PT treatment with SpO2 >97% throughout all physical activity. Min-mod cues from PT to increase pursed lip breathing throughout treatment.   Patient returned to room and [facility] reach and all needs met.         Therapy Documentation Precautions:  Precautions Precautions: Fall, Back Precaution Comments: watch O2 Required Braces or Orthoses: Spinal Brace Spinal Brace: Thoracolumbosacral orthotic, Applied in sitting position Restrictions Weight Bearing Restrictions: No Vital Signs: Oxygen Therapy SpO2: 98 % O2 Device: 1 L/min via nasal canula  Pain: Pain Assessment Pain Assessment: 0-10 Pain Score: 5-6 Pain Type: Acute pain;Surgical pain Pain Location: Back Pain Descriptors / Indicators: Aching Pain Onset: On-going Pain Intervention(s): Medication (See eMAR)   See Function Navigator for Current Functional Status.   Therapy/Group: Individual Therapy  AuLorie Phenix2/04/2017, 11:08 PM

## 2017-09-13 NOTE — Progress Notes (Signed)
Occupational Therapy Session Note  Patient Details  Name: Daniel ChandlerRandy Sisemore MRN: [id number] Date of Birth: 10/08/1956  Today's Date: 09/13/2017 OT Individual Time: [id number] OT Individual Time Calculation (min): 55 min    Short Term Goals: Week 1:  OT Short Term Goal 1 (Week 1): STGs=LTGs secondary to estimated short LOS  Skilled Therapeutic Interventions/Progress Updates:    Upon entering room, pt and wife present with no c/o pain. Pt able to recall what he had done in previous session and pain management strategies discussed with previous OT. Pt requesting to incorporate music preferred music into session. Pt ambualtes with RW with CGA for keeping brace donned as brace is ill-fitting and too large with VC for breathing while ambulating. Pt requires increased time to void bladder. Pt returns to w/c for rest break. Pt stands to make cup of coffee with CGA and VC for pursed lip breathing reaching for items and sequencing coffee making without VC. OT demonstrates use of shoe funnel and installs elastic laces into shoes. Pt return demonstrates by donning shoes with shoe funnel/min VC for technique while listening to [employer]Chris Tomlin music. Exited session with pt seated in w/c set up with dinner tray and call light in reach.   Therapy Documentation Precautions:  Precautions Precautions: Fall, Back Precaution Comments: watch O2 Required Braces or Orthoses: Spinal Brace Spinal Brace: Thoracolumbosacral orthotic, Applied in sitting position Restrictions Weight Bearing Restrictions: No  See Function Navigator for Current Functional Status.   Therapy/Group: Individual Therapy  Shon HaleStephanie M Shrey Boike 09/13/2017, 5:07 PM

## 2017-09-14 ENCOUNTER — Inpatient Hospital Stay (HOSPITAL_COMMUNITY): Payer: BC Managed Care – PPO

## 2017-09-14 DIAGNOSIS — M79662 Pain in left lower leg: Secondary | ICD-10-CM

## 2017-09-14 DIAGNOSIS — M7989 Other specified soft tissue disorders: Secondary | ICD-10-CM

## 2017-09-14 MED ORDER — ALBUTEROL SULFATE (2.5 MG/3ML) 0.083% IN NEBU: 2.5000 mg | INHALATION_SOLUTION | RESPIRATORY_TRACT | Status: DC | PRN

## 2017-09-14 MED ORDER — IPRATROPIUM-ALBUTEROL 0.5-2.5 (3) MG/3ML IN SOLN
3.0000 mL | Freq: Two times a day (BID) | RESPIRATORY_TRACT | Status: DC
  Administered 2017-09-15 – 2017-09-19 (×8): 3 mL via RESPIRATORY_TRACT
  Filled 2017-09-14 (×9): qty 3

## 2017-09-14 NOTE — Progress Notes (Signed)
VASCULAR LAB PRELIMINARY  PRELIMINARY  PRELIMINARY  PRELIMINARY  Left lower extremity venous duplex completed.    Preliminary report:  There is acute DVT in the left posterior tibial and peroneal veins.   Called report to Sue LushAndrea, Charity fundraiserN.  Mcarthur Ivins, RVT 09/14/2017, 12:29 PM
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
## 2017-09-15 ENCOUNTER — Inpatient Hospital Stay (HOSPITAL_COMMUNITY): Payer: BC Managed Care – PPO

## 2017-09-15 ENCOUNTER — Inpatient Hospital Stay (HOSPITAL_COMMUNITY): Payer: BC Managed Care – PPO | Admitting: Occupational Therapy

## 2017-09-15 NOTE — Progress Notes (Addendum)
Occupational Therapy Session Note  Patient Details  Name: Daniel Patton MRN: [id number] Date of Birth: 10/08/1955  Today's Date: 09/15/2017 OT Individual Time: 1300-1357 OT Individual Time Calculation (min): 57 min    Short Term Goals: Week 1:  OT Short Term Goal 1 (Week 1): STGs=LTGs secondary to estimated short LOS  Skilled Therapeutic Interventions/Progress Updates:   Per RN, no therapy restrictions for DVT.   Pt greeted in w/c at time of arrival, showered/dressed, wearing TLSO and ready to go. 02 sats 100% on RA. He ambulated with RW and supervision to dayroom. Pt engaging in meaningful dynamic standing activities while adhering to back precautions with min vcs. Activities included tennis ball pop-ups with racket and tabletop ping pong. Pt standing with RW and min guard and supervision respectively (chair placed behind him while playing ping pong with OT). Longest standing window during functional task 5 minutes, increasing from 1 min at start of tx! He required multiple seated rest breaks due to fatigue. Pt then ambulated back to room in manner as written above. TLSO with improved fit today and did not slip down during ambulation. He returned to w/c and 02 sats on RA 100%. Educated pt on methods for continuing meaningful leisure occupations while adhering to back precautions at home. He was receptive to this, reports he plans to continue playing ping pong when his children visit. Pt left in w/c at session exit with all needs within reach.   Therapy Documentation Precautions:  Precautions Precautions: Fall, Back Precaution Comments: watch O2 Required Braces or Orthoses: Spinal Brace Spinal Brace: Thoracolumbosacral orthotic, Applied in sitting position Restrictions Weight Bearing Restrictions: No Vital Signs: Therapy Vitals Temp: 97.7 F (36.5 C) Temp Source: Oral Pulse Rate: (!) 104 Resp: 18 BP: 120/86 Patient Position (if appropriate): Sitting Oxygen Therapy SpO2: 98 % O2  Device: Not Delivered Pain: Pain Assessment Pain Assessment: 0-10 Pain Score: 6  Pain Type: Acute pain Pain Location: Rib cage Pain Orientation: Left Pain Descriptors / Indicators: Aching Pain Frequency: Intermittent Pain Onset: Gradual Pain Intervention(s): Medication (See eMAR) ADL:      See Function Navigator for Current Functional Status.   Therapy/Group: Individual Therapy  Daniel Patton 09/15/2017, 4:20 PM

## 2017-09-15 NOTE — Progress Notes (Signed)
Physical Therapy Session Note  Patient Details  Name: Daniel Patton MRN: [id number] Date of Birth: 05/13/1956  Today's Date: 09/15/2017 PT Individual Time: 1615-1700 PT Individual Time Calculation (min): 45 min   Short Term Goals: Week 1:     Skilled Therapeutic Interventions/Progress Updates:    Pt seated in recliner upon PT arrival, agreeable to therapy tx and reports pain 5/10 in low back. Pt ambulated from room<>dayroom x 120 ft each way with RW and min assist, increased work of breathing noted. Pt worked on dynamic standing balance: performed toe taps, navigated around cones, and player checkers while standing on foam. Pt performed sit<>stands throughout session with min assist. Pt left seated in w/c at end of session with needs in reach. Pt reported feeling light headed at end of session when ambulating backing to room, vitals stable BP 130/81, HR 101, SpO2 98%, RN made aware.   Therapy Documentation Precautions:  Precautions Precautions: Fall, Back Precaution Comments: watch O2 Required Braces or Orthoses: Spinal Brace Spinal Brace: Thoracolumbosacral orthotic, Applied in sitting position Restrictions Weight Bearing Restrictions: No     See Function Navigator for Current Functional Status.   Therapy/Group: Individual Therapy  Cresenciano GenreEmily van Schagen, PT, DPT 09/15/2017, 5:05 PM

## 2017-09-15 NOTE — Progress Notes (Signed)
Occupational Therapy Session Note  Patient Details  Name: Daniel Patton MRN: [id number] Date of Birth: 1956-04-26  Today's Date: 09/15/2017 OT Individual Time: 0800-0900 OT Individual Time Calculation (min): 60 min    Short Term Goals: Week 1:  OT Short Term Goal 1 (Week 1): STGs=LTGs secondary to estimated short LOS  Skilled Therapeutic Interventions/Progress Updates:    1:1. Pt ready in w/c to start therapy. Pt requesting to shower this session. Pt ambulates with supervision and RW. Pt excited that the brace has been adjusted to fit properly. Pt transfers onto [facility] in shower using grab bar and supervision. Pt bathes with supervision using lateral leans to wash buttocks and preferred music playing. Pt dresses at sit to stand level seated on BSC in shower. Pt shaves/applies lotion/deodorant with set up seated in w/c. PT vitals measured and WNL. Pt propels w/c to family room to make cup of coffee in standing with supervision. OT propels w/c back to room 2/2 cramping in BLE and energy conservation. OT stretches B hamstrings to relieve cramping. OT exited session with pt seated in w/c and call light in reach/all needs met  Therapy Documentation Precautions:  Precautions Precautions: Fall, Back Precaution Comments: watch O2 Required Braces or Orthoses: Spinal Brace Spinal Brace: Thoracolumbosacral orthotic, Applied in sitting position Restrictions Weight Bearing Restrictions: No General:    See Function Navigator for Current Functional Status.   Therapy/Group: Individual Therapy  Tonny Branch 09/15/2017, 8:13 AM
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
## 2017-09-15 NOTE — Progress Notes (Signed)
Occupational Therapy Note  Patient Details  Name: Daniel ChandlerRandy Patton MRN: [id number] Date of Birth: 03/18/1956  Today's Date: 09/15/2017 OT Individual Time: 1130-1200 OT Individual Time Calculation (min): 30 min   Pt c/o 6/10 pain in L ribs and lower back; RN aware and repositioned Individual Therapy  Focus on w/c mobility, sit<>stand, standing balance, and functional amb with RW in ADL apartment.  Educated pt on RW safety.  Pt practiced retrieving items from refrigerator, retrieving items from drawers and transporting to another room.  Pt also practiced doffing/donning lumbar brace and bed mobility.  Pt completed all tasks at supervision level with min verbal cues for safety awareness.    Lavone NeriLanier, Azrael Huss [facility] 09/15/2017, 12:08 PM

## 2017-09-16 ENCOUNTER — Inpatient Hospital Stay (HOSPITAL_COMMUNITY): Payer: BC Managed Care – PPO | Admitting: Occupational Therapy

## 2017-09-16 ENCOUNTER — Inpatient Hospital Stay (HOSPITAL_COMMUNITY): Payer: BC Managed Care – PPO

## 2017-09-16 DIAGNOSIS — I824Z2 Acute embolism and thrombosis of unspecified deep veins of left distal lower extremity: Secondary | ICD-10-CM

## 2017-09-16 MED ORDER — ENOXAPARIN SODIUM 30 MG/0.3ML ~~LOC~~ SOLN
30.0000 mg | Freq: Two times a day (BID) | SUBCUTANEOUS | Status: AC
  Administered 2017-09-17 – 2017-09-18 (×4): 30 mg via SUBCUTANEOUS
  Filled 2017-09-16 (×4): qty 0.3

## 2017-09-16 NOTE — Progress Notes (Signed)
Occupational Therapy Session Note  Patient Details  Name: Daniel ChandlerRandy Patton MRN: [id number] Date of Birth: 10/12/1955  Today's Date: 09/16/2017 OT Individual Time: [id number] OT Individual Time Calculation (min): 62 min   Short Term Goals: Week 1:  OT Short Term Goal 1 (Week 1): STGs=LTGs secondary to estimated short LOS  Skilled Therapeutic Interventions/Progress Updates:    Tx focus on balance, functional ambulation with device, and adherence to back precautions during meaningful tasks.   Pt greeted in w/c with TLSO, already bathed/dressed and ready to go. Once he received pain medication, he ambulated with RW to dayroom with supervision. Pt engaging in modified golfing with putting green with min cues for precaution adherence. Pt standing without device and Min A during dynamic activity. Cued him to bend with his knees and activate core musculature to decrease strain on back. He required several rest breaks due to fatigue. Pt then practiced donning/doffing Teds with instruction on modified technique. He did so with supervision, min cues for adherence to 90 degree hip flexion precaution while he achieved seated figure 4. Able to don/doff sneakers as well. He then ambulated ~610 ft in unit with supervision and 1 seated rest break. When pt returned to room, he was left in w/c with all needs within reach.   Therapy Documentation Precautions:  Precautions Precautions: Fall, Back Precaution Comments: watch O2 Required Braces or Orthoses: Spinal Brace Spinal Brace: Thoracolumbosacral orthotic, Applied in sitting position Restrictions Weight Bearing Restrictions: No Pain: Pain Assessment Pain Assessment: 0-10 Pain Score: 5  Pain Type: Acute pain Pain Location: Rib cage Pain Orientation: Left Pain Descriptors / Indicators: Aching Pain Frequency: Constant Pain Onset: On-going Pain Intervention(s): Medication (See eMAR) ADL:       See Function Navigator for Current Functional  Status.   Therapy/Group: Individual Therapy  [name] A [name] 09/16/2017, 12:51 PM

## 2017-09-16 NOTE — Progress Notes (Signed)
Physical Therapy Note  Patient Details  Name: Daniel Patton MRN: [id number] Date of Birth: 10/13/1955 Today's Date: 09/16/2017  1500-1600, 60 min individual tx Pain: 5/10 L ribs TLSO already on at start of session.  W/c propulsion using bil UEs on level tile x 150' . O2 sats on room air 94%, HR 109.   Gait training with RW on level tile x 90' x 2 with supervision, cues for decreased dependence on UEs and breathing/standing rest breaks PRN throughout exertion. Transferred to low sofa without cues, RW.  Cues for reaching back before sitting on bed.  Bed mobility and doffing and donning TLSO on regular bed in ADL apt with supervision. Education on donning TLSO regarding underarm straps, and tightening cords with both hands at same time to limit rotation.   Strengthening exs in supine: 10 x 1 each lower trunk rotation and bil bridging, and 10 x 1 bil bridging with bil hip adductor squeeze to activate core. Pt sat up fast due to L rib pain, with resulting light headedness; discussed safety issues. Pt reported he has 11 steps 2 rails to enter his house; wife counted them.  Up/down (4) 6" high steps, 2 hands on L rail, step to method.  Pt DOE after descending 4 steps and asked to sit down.  O2 sats above 90%. Pt left resting in w/c with all needs within reach.  See function navigator for current status.  Satoshi Kalas 09/16/2017, 3:32 PM
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
## 2017-09-17 ENCOUNTER — Inpatient Hospital Stay (HOSPITAL_COMMUNITY): Payer: BC Managed Care – PPO | Admitting: Occupational Therapy

## 2017-09-17 ENCOUNTER — Inpatient Hospital Stay (HOSPITAL_COMMUNITY): Payer: BC Managed Care – PPO

## 2017-09-17 ENCOUNTER — Inpatient Hospital Stay (HOSPITAL_COMMUNITY): Payer: BC Managed Care – PPO | Admitting: Physical Therapy

## 2017-09-17 LAB — CBC
HCT: 26.7 % — ABNORMAL LOW (ref 39.0–52.0)
Hemoglobin: 8.4 g/dL — ABNORMAL LOW (ref 13.0–17.0)
MCH: 29 pg (ref 26.0–34.0)
MCHC: 31.5 g/dL (ref 30.0–36.0)
MCV: 92.1 fL (ref 78.0–100.0)
PLATELETS: 596 10*3/uL — AB (ref 150–400)
RBC: 2.9 MIL/uL — ABNORMAL LOW (ref 4.22–5.81)
RDW: 15.3 % (ref 11.5–15.5)
WBC: 9.4 10*3/uL (ref 4.0–10.5)
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
## 2017-09-17 NOTE — Progress Notes (Signed)
Physical Therapy Session Note  Patient Details  Name: Daniel ChandlerRandy Patton MRN: [id number] Date of Birth: 07/13/1956  Today's Date: 09/17/2017 PT Individual Time: 1045-1130 PT Individual Time Calculation (min): 45 min   Short Term Goals: Week 1:     Skilled Therapeutic Interventions/Progress Updates:    Patient up in w/c in room.  Propelled w/c from around bed with S in tight space.  Sit to stand S to RW and ambulated S to stairwell, negotiated 10 steps with L railing min A and cues for foot placement.  Patient dyspneic so standing rest needed on steps prior to descent and seated rest once returned to gym.  Patient in parallel bars for standing balance activities to include step taps to bean bags with one vs no UE support; standing feet apart then feet together with head turns, gait in parallel bars no UE support and with kicking bean bags for increased SLS time.  Patient ambulated with RW to room and demonstrated sit<.>supine with cues for technique and S. Stand pivot to w/c with RW S.  Patient left in w/c with all needs in reach.   Therapy Documentation Precautions:  Precautions Precautions: Fall, Back Precaution Comments: watch O2 Required Braces or Orthoses: Spinal Brace Spinal Brace: Thoracolumbosacral orthotic, Applied in sitting position Restrictions Weight Bearing Restrictions: No Vital Signs: Oxygen Therapy SpO2: 95 % O2 Device: Not Delivered Pain: Pain Assessment Pain Assessment: 0-10 Pain Score: 6  Pain Type: Acute pain Pain Location: Rib cage Pain Orientation: Left Pain Descriptors / Indicators: Aching;Sore Pain Intervention(s): Repositioned;Ambulation/increased activity Multiple Pain Sites: No   See Function Navigator for Current Functional Status.   Therapy/Group: Individual Therapy  Elray McgregorCynthia Wynn 09/17/2017, 11:02 AM
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
## 2017-09-17 NOTE — Progress Notes (Signed)
Occupational Therapy Session Note  Patient Details  Name: Daniel Patton MRN: [id number] Date of Birth: 12/23/1955  Today's Date: 09/17/2017 OT Individual Time: [id number] and 1500-1540 OT Individual Time Calculation (min): 83 min and 40 min    Short Term Goals: Week 1:  OT Short Term Goal 1 (Week 1): STGs=LTGs secondary to estimated short LOS   Skilled Therapeutic Interventions/Progress Updates:    Session 1: Upon entering the room, pt seated in wheelchair awaiting therapist. Skilled OT intervention with focus on self care retraining, functional transfers, energy conservation education,and pursed lip breathing. Pt ambulating to dresser to obtain clothing and utilizing RW to bring all items into bathroom. Pt doffing clothing with supervision for balance. Pt seated on shower chair for bathing at shower level at overall supervision with pt able to cross leg to lap in order to wash B feet. Pt able to don and doff brace with increased time. Pt donning UB clothing, brace, and shorts from seated position on shower seat with overall supervision and min cues for technique and safety. Pt ambulates to sit in wheelchair at sink for grooming tasks and pt able to don B TEDs and shoes with elastic laces while maintaining precautions with supervision. Pt taking multiple rest breaks for fatigue with min verbal cues for pursed lip breathing technique. Energy education also discussed in regards to self care and discharge situations. Pt remained in wheelchair at end of session with call bell and all needed items within reach.   Session 2: Upon entering the room, pt seated in wheelchair and just received pain medication. Pt reports 10/10 pain in ribs during this time but agreeable to OT intervention. OT propelled pt via wheelchair to ADL apartment secondary to increased pain. OT educated and demonstrates use of RW for transfer onto TTB. OT demonstrated proper technique in order to not break hip precautions. OT also  educating pt on most common examples of when precautions are broken during self care tasks and how to modify activity appropriately. Pt verbalized understanding. Pt declined to practice transfer. Pt becoming very sleepy from medication and continues to have increased pain. Pt returned to room via wheelchair. Stand pivot transfer from wheelchair >recliner with supervision and use of RW. Call bell and all needed items within reach upon exiting the room.   Therapy Documentation Precautions:  Precautions Precautions: Fall, Back Precaution Comments: watch O2 Required Braces or Orthoses: Spinal Brace Spinal Brace: Thoracolumbosacral orthotic, Applied in sitting position Restrictions Weight Bearing Restrictions: No General:   Vital Signs:   Pain: Pain Assessment Pain Assessment: 0-10 Pain Score: 6  Pain Type: Acute pain Pain Location: Rib cage Pain Orientation: Left Pain Descriptors / Indicators: Aching;Sore Pain Frequency: Intermittent Pain Onset: On-going Patients Stated Pain Goal: 2 Pain Intervention(s): Repositioned;Ambulation/increased activity Multiple Pain Sites: No ADL:   Vision   Perception    Praxis   Exercises:   Other Treatments:    See Function Navigator for Current Functional Status.   Therapy/Group: Individual Therapy  [name], [name] 09/17/2017, 12:56 PM

## 2017-09-17 NOTE — Progress Notes (Signed)
Physical Therapy Session Note  Patient Details  Name: Nylan Nevel MRN: [id number] Date of Birth: [birth year]  Today's Date: 09/17/2017 PT Individual Time: 7262-0355 PT Individual Time Calculation (min): 40 min   Skilled Therapeutic Interventions/Progress Updates:   Pt in recliner upon arrival and agreeable to therapy, requesting to toilet, pain as listed below. Ambulated to bathroom w/ min guard using RW and had continent bowel movement, total assist for pericare 2/2 back precautions. Ambulated 50-75' x4 w/ prolonged seated rest in between bouts. O2 sat remained >95% however pt reporting increase SOB this session w/ mobility. Worked on breathing techniques during rest breaks w/ decreased work of breathing afterward. Returned to room and ended session in recliner, call bell within reach and all needs met.   Therapy Documentation Precautions:  Precautions Precautions: Fall, Back Precaution Comments: watch O2 Required Braces or Orthoses: Spinal Brace Spinal Brace: Thoracolumbosacral orthotic, Applied in sitting position Restrictions Weight Bearing Restrictions: No Pain: Pain Assessment Pain Score: 2   See Function Navigator for Current Functional Status.   Therapy/Group: Individual Therapy  Shyheim Tanney K Arnette 09/17/2017, 7:22 PM

## 2017-09-18 ENCOUNTER — Inpatient Hospital Stay (HOSPITAL_COMMUNITY): Payer: BC Managed Care – PPO | Admitting: Physical Therapy

## 2017-09-18 ENCOUNTER — Inpatient Hospital Stay (HOSPITAL_COMMUNITY): Payer: BC Managed Care – PPO | Admitting: Occupational Therapy

## 2017-09-18 ENCOUNTER — Inpatient Hospital Stay (HOSPITAL_COMMUNITY): Payer: BC Managed Care – PPO

## 2017-09-18 LAB — CBC
HEMATOCRIT: 25.7 % — AB (ref 39.0–52.0)
Hemoglobin: 8.3 g/dL — ABNORMAL LOW (ref 13.0–17.0)
MCH: 29.7 pg (ref 26.0–34.0)
MCHC: 32.3 g/dL (ref 30.0–36.0)
MCV: 92.1 fL (ref 78.0–100.0)
PLATELETS: 572 10*3/uL — AB (ref 150–400)
RBC: 2.79 MIL/uL — ABNORMAL LOW (ref 4.22–5.81)
RDW: 15.3 % (ref 11.5–15.5)
WBC: 9.4 10*3/uL (ref 4.0–10.5)

## 2017-09-18 LAB — BASIC METABOLIC PANEL
ANION GAP: 7 (ref 5–15)
BUN: 19 mg/dL (ref 6–20)
CALCIUM: 8.6 mg/dL — AB (ref 8.9–10.3)
CO2: 26 mmol/L (ref 22–32)
CREATININE: 2.14 mg/dL — AB (ref 0.61–1.24)
Chloride: 102 mmol/L (ref 101–111)
GFR, EST AFRICAN AMERICAN: 36 mL/min — AB (ref >60)
GFR, EST NON AFRICAN AMERICAN: 31 mL/min — AB (ref >60)
Glucose, Bld: 103 mg/dL — ABNORMAL HIGH (ref 65–99)
Potassium: 4.6 mmol/L (ref 3.5–5.1)
Sodium: 135 mmol/L (ref 135–145)

## 2017-09-18 LAB — PROTIME-INR
INR: 1.24
Prothrombin Time: 15.5 seconds — ABNORMAL HIGH (ref 11.4–15.2)

## 2017-09-18 NOTE — Progress Notes (Signed)
Physical Therapy Session Note  Patient Details  Name: Daniel Patton MRN: [id number] Date of Birth: 12/01/1955  Today's Date: 09/18/2017 PT Individual Time:Session1: [id number], Session2:1300-1330 PT Individual Time Calculation (min): 26 min   Short Term Goals: Week 1:     Skilled Therapeutic Interventions/Progress Updates:    Session1: Patient in room in w/c and assist to stand minguard A and ambulated with RW and minguard to S to day room.  Seated therex for LE strength and endurance with 4 extremities 5 min then 2 min at level 4.  Vitals as below after first 5 min on Nu Step and recovered to 94% in 30 sec. Patient performed 5 x sit to stand x 2 reps with min UE assist for LE strength.  Noted HR up to 122.  Education on compete recovery during rest breaks to avoid excessive fatigue end of day.  Patient ambulated back to room and left in w/c able to access call button and table on his own.  Session2: Patient in w/c and wife in room.  Educated on items pt needs assist with.  Patient requested to propel to stairs for energy conservation.  Patient propelled independently with UE and LE's.  Sit to stand S and amb with walker to stairs and negotiated with L railing and minguard A x 10 stairs with wife present for education/demonstration for technique for assist.  Patient with HR 121, SpO2 91% after stair negotiation.  Patient negotiated again with wife managing walker and assist for safety appropriately.  Discussed plan for follow up HHPT and need for RW with pt/wife and SW.    Therapy Documentation Precautions:  Precautions Precautions: Fall, Back Precaution Comments: watch O2 Required Braces or Orthoses: Spinal Brace Spinal Brace: Thoracolumbosacral orthotic, Applied in sitting position Restrictions Weight Bearing Restrictions: No Vital Signs: Therapy Vitals Pulse Rate: (!) 119 Resp: 18 Patient Position (if appropriate): Sitting Oxygen Therapy SpO2: (!) 89 %(after 5 min on nu step) O2  Device: Not Delivered Pain: Pain Assessment Pain Assessment: 0-10 Pain Score: 6  Pain Type: Acute pain Pain Location: Back(and ribs) Pain Orientation: Left Pain Descriptors / Indicators: Aching;Sore Pain Frequency: Intermittent Pain Onset: With Activity Patients Stated Pain Goal: 2 Pain Intervention(s): Ambulation/increased activity Multiple Pain Sites: No   See Function Navigator for Current Functional Status.   Therapy/Group: Individual Therapy  Elray McgregorCynthia Jamar Weatherall 09/18/2017, 11:49 AM

## 2017-09-18 NOTE — Progress Notes (Signed)
Physical Therapy Session Note  Patient Details  Name: Daniel Patton MRN: [id number] Date of Birth: 03/25/1956  Today's Date: 09/18/2017 PT Individual Time: 1400-1430 PT Individual Time Calculation (min): 30 min   Skilled Therapeutic Interventions/Progress Updates:    Session focused on family education with pt's wife in regards to upcoming d/c including basic transfers, donning/doffing TLSO, simulated car transfer, furniture transfers, bed mobility on regular bed without rails, and education on positioning for sleeping and utilizing a wedge/pillow due to pt intolerance to laying flat. Pt performed mobility at overall supervision approaching modified independent level using RW. Rest breaks needed to catch breath between mobility activities. Good adherence noted to back precautions throughout session.   Therapy Documentation Precautions:  Precautions Precautions: Fall, Back Precaution Comments: watch O2 Required Braces or Orthoses: Spinal Brace Spinal Brace: Thoracolumbosacral orthotic, Applied in sitting position Restrictions Weight Bearing Restrictions: No Pain: No complaints this session      See Function Navigator for Current Functional Status.   Therapy/Group: Individual Therapy  Karolee StampsGray, Aloysuis Ribaudo Darrol PokeBrescia  Thomos Domine B. Taffie Eckmann, PT, DPT  09/18/2017, 2:37 PM

## 2017-09-18 NOTE — Progress Notes (Signed)
Physical Therapy Session Note  Patient Details  Name: Daniel Patton MRN: [id number] Date of Birth: 06/02/1956  Today's Date: 09/18/2017 PT Individual Time: 0815-0900 PT Individual Time Calculation (min): 45 min   Short Term Goals: Week 1:     Skilled Therapeutic Interventions/Progress Updates: Pt presented in w/c brushing teeth agreeable to therapy. Respiratory therapy also present to administer treatment, therapy session delayed to allow pt to receive treatment. Pt transported to stairwell for energy conservation. Pt ascend/descend x 11 steps with 1 rail min guard step to pattern. Pt took x 1 standing rest break during stairs for breathing, PTA providing cues for diaphragmatic breathing. Participated in obstacle course including uneven surface, thresholds, weaving, and steps x 4 with min guard and demonstrating good safety. Performed seated/standing therex as follows: LAQ 2x 10 with 3#, HS pulls in standing 2x10, attempted hip abd with pt noted increased pain in LB, deferred and sit to stand no UE 2 x 5. Pt required seated rests due to fatigue and SOB with SpO2 remaining >95%. Pt returned to room and remained in w/c with needs met.      Therapy Documentation Precautions:  Precautions Precautions: Fall, Back Precaution Comments: watch O2 Required Braces or Orthoses: Spinal Brace Spinal Brace: Thoracolumbosacral orthotic, Applied in sitting position Restrictions Weight Bearing Restrictions: No General: PT Amount of Missed Time (min): 15 Minutes PT Missed Treatment Reason: Other (Comment)(Resp treatment) Vital Signs: Therapy Vitals Temp: 97.9 F (36.6 C) Temp Source: Oral Pulse Rate: (!) 110 Resp: 18 BP: 124/72 Patient Position (if appropriate): Sitting Oxygen Therapy SpO2: 98 % O2 Device: Not Delivered Pain: Pain Assessment Pain Score: 6  Pain Type: Acute pain Pain Location: Back(and ribs) Pain Orientation: Left Pain Descriptors / Indicators: Aching;Sore Pain Onset: With  Activity Pain Intervention(s): Ambulation/increased activity   See Function Navigator for Current Functional Status.   Therapy/Group: Individual Therapy  Lashara Urey  Minas Bonser, PTA  09/18/2017, 3:40 PM

## 2017-09-18 NOTE — Progress Notes (Signed)
Occupational Therapy Session Note  Patient Details  Name: Daniel ChandlerRandy Dwan MRN: [id number] Date of Birth: 09/06/1956  Today's Date: 09/18/2017 OT Individual Time: [id number] and [id number] OT Individual Time Calculation (min): 72 min and 40 min    Short Term Goals: Week 1:  OT Short Term Goal 1 (Week 1): STGs=LTGs secondary to estimated short LOS  Skilled Therapeutic Interventions/Progress Updates:    Session 1: Upon entering the room, pt seated in wheelchair awaiting therapist with 5/10 pain in ribs. Pt received pain medication prior to OT arrival. Pt requesting shower this session. Pt ambulates with RW at supervision level to obtain clothing and all needed items within reach. Pt ambulating to bathroom and seated on TTB for bathing at shower level with overall mod I for transfer, doffing clothing, and bathing. Pt donned clothing from seated position with LB for supervision for safety. Pt pt seated in wheelchair at sink for grooming tasks at overall mod I. OT provided paper handout for energy conservation for self care tasks. Pt to read and discuss at next session. Pt needing multiple rest breaks throughout out session secondary to fatigue. Call bell and all needed items within reach upon exiting the room.   Session 2: Upon entering the room, pt fatigued and reports increased pain. Pt awaiting pain medication with RN arrival during this session. Pt's wife present during session for family education. OT reviewed energy conservation techniques and referred to handout previously given. Pt and caregiver asking questions as appropriate. Pt continues to report increase pain and RN arrives with pain medication. OT provided demonstration in ADL apartment for transfer onto TTB with use of RW while maintaining back precautions. Wife verbalized understanding and agreement. OT also discussing recommendation for HHOT initially and then progressing to outpatient therapy services. Caregiver asking questions and returning  back to room at end of session. No further questions at this time. Call bell and all needed items within reach upon exiting the room.   Therapy Documentation Precautions:  Precautions Precautions: Fall, Back Precaution Comments: watch O2 Required Braces or Orthoses: Spinal Brace Spinal Brace: Thoracolumbosacral orthotic, Applied in sitting position Restrictions Weight Bearing Restrictions: No General:   Vital Signs: Therapy Vitals Pulse Rate: (!) 111 Resp: 18 Patient Position (if appropriate): Sitting Oxygen Therapy SpO2: 98 % O2 Device: Not Delivered Pain: Pain Assessment Pain Assessment: 0-10 Pain Score: 6  Pain Type: Acute pain Pain Location: Rib cage Pain Orientation: Left Pain Descriptors / Indicators: Aching Pain Frequency: Intermittent Pain Onset: Gradual Patients Stated Pain Goal: 2 Pain Intervention(s): Medication (See eMAR) Multiple Pain Sites: No ADL:   Vision   Perception    Praxis   Exercises:   Other Treatments:    See Function Navigator for Current Functional Status.   Therapy/Group: Individual Therapy  [name], Rajean Desantiago P 09/18/2017, 10:27 AM
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
## 2017-09-18 NOTE — Progress Notes (Signed)
Pt wear home CPAP unit.  Pt does not need assistance.
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
## 2017-09-19 ENCOUNTER — Inpatient Hospital Stay (HOSPITAL_COMMUNITY): Payer: BC Managed Care – PPO | Admitting: Occupational Therapy

## 2017-09-19 ENCOUNTER — Inpatient Hospital Stay (HOSPITAL_COMMUNITY): Payer: BC Managed Care – PPO

## 2017-09-19 ENCOUNTER — Encounter (HOSPITAL_COMMUNITY): Payer: Self-pay | Admitting: Interventional Radiology

## 2017-09-19 HISTORY — PX: IR IVC FILTER PLMT / S&I /IMG GUID/MOD SED: IMG701

## 2017-09-19 MED ORDER — MIDAZOLAM HCL 2 MG/2ML IJ SOLN
INTRAMUSCULAR | Status: AC
  Filled 2017-09-19: qty 2

## 2017-09-19 MED ORDER — IOPAMIDOL (ISOVUE-300) INJECTION 61%
INTRAVENOUS | Status: AC
  Administered 2017-09-19: 35 mL
  Filled 2017-09-19: qty 150

## 2017-09-19 MED ORDER — LIDOCAINE HCL 1 % IJ SOLN
INTRAMUSCULAR | Status: AC
  Filled 2017-09-19: qty 20

## 2017-09-19 MED ORDER — MIDAZOLAM HCL 2 MG/2ML IJ SOLN
INTRAMUSCULAR | Status: AC | PRN
  Administered 2017-09-19: 1 mg via INTRAVENOUS

## 2017-09-19 MED ORDER — LIDOCAINE HCL (PF) 1 % IJ SOLN
INTRAMUSCULAR | Status: AC | PRN
  Administered 2017-09-19: 10 mL

## 2017-09-19 MED ORDER — FENTANYL CITRATE (PF) 100 MCG/2ML IJ SOLN
INTRAMUSCULAR | Status: AC | PRN
  Administered 2017-09-19: 50 ug via INTRAVENOUS

## 2017-09-19 MED ORDER — FENTANYL CITRATE (PF) 100 MCG/2ML IJ SOLN
INTRAMUSCULAR | Status: AC
  Filled 2017-09-19: qty 2

## 2017-09-19 NOTE — Progress Notes (Signed)
Physical Therapy Discharge Summary  Patient Details  Name: Daniel Patton MRN: [id number] Date of Birth: 05-May-1956  Today's Date: 09/19/2017 PT Individual Time:  -       Patient has met 11 of 11 long term goals due to improved activity tolerance, improved balance, increased strength and ability to compensate for deficits.  Patient to discharge at an ambulatory level Modified Independent.   Patient's care partner is independent to provide the necessary physical assistance at discharge.  Reasons goals not met: N/A  Recommendation:  Patient will benefit from ongoing skilled PT services in home health setting to continue to advance safe functional mobility, address ongoing impairments in balance and endurance, and minimize fall risk.  Equipment: Rolling walker  Reasons for discharge: discharge from hospital  Patient/family agrees with progress made and goals achieved: Yes  PT Discharge Precautions/Restrictions Precautions Precautions: Fall;Back Required Braces or Orthoses: Spinal Brace Spinal Brace: Thoracolumbosacral orthotic;Applied in sitting position Vital Signs  Pain Pain Assessment Pain Assessment: 0-10 Pain Score: 7  Pain Type: Acute pain Pain Location: Rib cage Pain Orientation: Left Pain Radiating Towards: back Pain Descriptors / Indicators: Aching Pain Frequency: Intermittent Pain Onset: Gradual Patients Stated Pain Goal: 2 Pain Intervention(s): Rest Vision/Perception     Cognition Overall Cognitive Status: Within Functional Limits for tasks assessed Arousal/Alertness: Awake/alert Orientation Level: Oriented X4 Attention: Selective Sustained Attention: Appears intact Selective Attention: Appears intact Memory: Appears intact Awareness: Appears intact Problem Solving: Appears intact Safety/Judgment: Appears intact Sensation Sensation Light Touch: Appears Intact Stereognosis: Not tested Hot/Cold: Appears Intact Proprioception: Appears  Intact Coordination Gross Motor Movements are Fluid and Coordinated: Yes Fine Motor Movements are Fluid and Coordinated: Yes Motor  Motor Motor: Within Functional Limits Motor - Discharge Observations: some muscle fasciculations/spasms at times  Mobility Bed Mobility Bed Mobility: Rolling Left;Left Sidelying to Sit Rolling Left: 6: Modified independent (Device/Increase time) Left Sidelying to Sit: 6: Modified independent (Device/Increase time) Transfers Sit to Stand: 6: Modified independent (Device/Increase time) Stand to Sit: 6: Modified independent (Device/Increase time) Stand Pivot Transfers: 6: Modified independent (Device/Increase time) Locomotion  Ambulation Ambulation: Yes Ambulation/Gait Assistance: 6: Modified independent (Device/Increase time) Ambulation Distance (Feet): 150 Feet Assistive device: Rolling walker Gait Gait: Yes Gait Pattern: Impaired Gait Pattern: Decreased stride length;Decreased trunk rotation Stairs / Additional Locomotion Stairs: Yes Stairs Assistance: 4: Min guard Stair Management Technique: One rail Left;Sideways Number of Stairs: 12 Height of Stairs: 6 Ramp: 6: Modified independent (Device) Curb: 5: Psychiatric nurse: Yes Wheelchair Assistance: 6: Modified independent (Device/Increase time) Environmental health practitioner: Both upper extremities;Both lower extermities Distance: 150  Trunk/Postural Assessment  Cervical Assessment Cervical Assessment: Within Functional Limits Thoracic Assessment Thoracic Assessment: Exceptions to WFL(iin TLSO) Lumbar Assessment Lumbar Assessment: Exceptions to WFL(in TLSO)  Balance Balance Balance Assessed: Yes Static Sitting Balance Static Sitting - Level of Assistance: 6: Modified independent (Device/Increase time) Dynamic Sitting Balance Dynamic Sitting - Level of Assistance: 6: Modified independent (Device/Increase time) Static Standing Balance Static Standing - Level of  Assistance: 6: Modified independent (Device/Increase time) Dynamic Standing Balance Dynamic Standing - Level of Assistance: 6: Modified independent (Device/Increase time) Extremity Assessment  RUE Assessment RUE Assessment: Within Functional Limits LUE Assessment LUE Assessment: Within Functional Limits RLE Assessment RLE Assessment: Within Functional Limits LLE Assessment LLE Assessment: Within Functional Limits   See Function Navigator for Current Functional Status.  Reginia Naas 09/19/2017, 6:43 PM

## 2017-09-19 NOTE — Progress Notes (Signed)
Occupational Therapy Note  Patient Details  Name: Daniel ChandlerRandy Patton MRN: [id number] Date of Birth: 10/08/1955  Today's Date: 09/19/2017 OT Missed Time: 60 Minutes Missed Time Reason: Patient on bedrest  Pt missed 60 minutes of  OT intervention secondary to recently coming back from procedure and now on bedrest until 1300.   [name], [name] 09/19/2017, 12:47 PM

## 2017-09-19 NOTE — Sedation Documentation (Signed)
Patient is resting comfortably. 

## 2017-09-19 NOTE — Discharge Summary (Signed)
Discharge summary job # [id number] 

## 2017-09-19 NOTE — Procedures (Signed)
Pre procedural Dx: DVT with contraindication to anticoagulation Post Procedural Dx: Same  Successful placement of an infrarenal IVC filter via the right IJ.  EBL: Trace  Complications: None immediate  Katherina RightJay Ariany Kesselman, MD Pager #: [phone]

## 2017-09-19 NOTE — Progress Notes (Signed)
RT went to place PT on CPAP.  PT has home CPAP RT placed CPAP within reach of PT and added sterile water for humidity.

## 2017-09-19 NOTE — Sedation Documentation (Signed)
Vital signs stable. 

## 2017-09-19 NOTE — Progress Notes (Signed)
Occupational Therapy Session Note  Patient Details  Name: Daniel ChandlerRandy Emory MRN: [id number] Date of Birth: 10/09/1955  Today's Date: 09/19/2017 OT Individual Time: 1430-1525 OT Individual Time Calculation (min): 55 min    Short Term Goals: Week 1:  OT Short Term Goal 1 (Week 1): STGs=LTGs secondary to estimated short LOS Week 2:     Skilled Therapeutic Interventions/Progress Updates:    Pt resting in w/c upon arrival with wife present.  Per report pt bathed and dressed without assistance earlier.  Pt on bedrest earlier in day after IVC placement.  Pt propelled w/c to ADL apartment and practiced tub bench transfers, bed transfers, toilet transfers, and toileting tasks.  Continued education and home safety education.  Pt mod I for BADLs.  Pt propelled back to room and remained in w/c with all needs within reach.    Therapy Documentation Precautions:  Precautions Precautions: Fall, Back Precaution Comments: watch O2 Required Braces or Orthoses: Spinal Brace Spinal Brace: Thoracolumbosacral orthotic, Applied in sitting position Restrictions Weight Bearing Restrictions: No Pain: Pain Assessment Pain Assessment: 0-10 Pain Score: 7  Pain Type: Acute pain Pain Location: Rib cage Pain Orientation: Left Pain Descriptors / Indicators: Aching Pain Frequency: Constant Pain Onset: On-going Pain Intervention(s): Ambulation/increased activity  See Function Navigator for Current Functional Status.   Therapy/Group: Individual Therapy  Rich BraveLanier, Mirian Casco Chappell 09/19/2017, 3:44 PM

## 2017-09-19 NOTE — Progress Notes (Signed)
Occupational Therapy Discharge Summary  Patient Details  Name: Daniel Patton MRN: [id number] Date of Birth: Mar 27, 1956    Patient has met 10 of 10 long term goals due to improved activity tolerance, improved balance and ability to compensate for deficits.  Patient to discharge at overall Modified Independent level.  Patient's care partner is independent to provide the necessary supervision for shower transfer assistance at discharge.   Reasons goals not met: all goals met  Recommendation:  Patient will benefit from ongoing skilled OT services in home health setting to continue to advance functional skills in the area of BADL and iADL.  Equipment: TTB and BSC  Reasons for discharge: treatment goals met  Patient/family agrees with progress made and goals achieved: Yes  OT Discharge Precautions/Restrictions  Precautions Precautions: Fall;Back Required Braces or Orthoses: Spinal Brace Spinal Brace: Thoracolumbosacral orthotic;Applied in sitting position General OT Amount of Missed Time: 60 Minutes  Pain Pain Assessment Pain Score: 7  Pain Type: Acute pain Pain Location: Rib cage Pain Orientation: Left Pain Intervention(s): Ambulation/increased activity   Vision Baseline Vision/History: Wears glasses Wears Glasses: At all times Patient Visual Report: No change from baseline Cognition Overall Cognitive Status: Within Functional Limits for tasks assessed Arousal/Alertness: Awake/alert Orientation Level: Oriented X4 Sensation Sensation Light Touch: Appears Intact Stereognosis: Not tested Hot/Cold: Appears Intact Proprioception: Appears Intact Coordination Gross Motor Movements are Fluid and Coordinated: Yes Fine Motor Movements are Fluid and Coordinated: Yes Motor    Mobility  Transfers Transfers: Sit to Stand;Stand to Sit Sit to Stand: 6: Modified independent (Device/Increase time) Stand to Sit: 6: Modified independent (Device/Increase time)  Trunk/Postural  Assessment  Cervical Assessment Cervical Assessment: Within Functional Limits Thoracic Assessment Thoracic Assessment: Exceptions to WFL(in TLSO) Lumbar Assessment Lumbar Assessment: Exceptions to WFL(in TLSO)  Balance Balance Balance Assessed: Yes Static Sitting Balance Static Sitting - Level of Assistance: 6: Modified independent (Device/Increase time) Dynamic Sitting Balance Dynamic Sitting - Level of Assistance: 6: Modified independent (Device/Increase time) Static Standing Balance Static Standing - Level of Assistance: 6: Modified independent (Device/Increase time) Dynamic Standing Balance Dynamic Standing - Level of Assistance: 6: Modified independent (Device/Increase time) Extremity/Trunk Assessment RUE Assessment RUE Assessment: Within Functional Limits LUE Assessment LUE Assessment: Within Functional Limits   See Function Navigator for Current Functional Status.  Gypsy Decant 09/19/2017, 4:39 PM

## 2017-09-19 NOTE — Sedation Documentation (Signed)
Patient is resting comfortably. No complaints at this time 
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
## 2017-09-19 NOTE — Progress Notes (Signed)
Social Work Patient ID: Daniel Patton, male   DOB: 1955/10/13, 61 y.o.   MRN: [id number]   Met with pt and wife following team conference.  Both aware and agreeable with d/c date for 12/14 and plan for [facility] follow up to start.  Wife completed family ed yesterday.  Lanasia Porras, LCSW

## 2017-09-19 NOTE — Progress Notes (Signed)
Physical Therapy Session Note  Patient Details  Name: Daniel Patton MRN: [id number] Date of Birth: 09/04/1956  Today's Date: 09/19/2017 PT Individual Time: 1305-1405 PT Individual Time Calculation (min): 60 min   Short Term Goals: Week 1:     Skilled Therapeutic Interventions/Progress Updates:    Patient in bed and reports having LE spasms since IVC filter placement this morning.  Held pressure over L medial thigh to relax spasm x 2 minutes.  Patient supine to sit through sidelying mod I.  Sit to stand mod I and toileted mod I.  Patient in w/c propelled to gym in reverse per his preference.  Patient sit to stand mod I with good technique and ambulated 150' with RW mod I.  Patient negotiated 12 steps with L rail sideways with close S to minguard for safety.  Ambulated to ortho gym w/ RW and performed car transfer with S.  Patient demonstrated sitting balance to don shoes in room and standing balance with pulling up pants and adjusting brace mod I.  Patient able to verbalize 3 of 3 Patton precautions independently.  Discussed d/c plans and pt without questions or concerns.   Therapy Documentation Precautions:  Precautions Precautions: Fall, Patton Precaution Comments: watch O2 Required Braces or Orthoses: Spinal Brace Spinal Brace: Thoracolumbosacral orthotic, Applied in sitting position Restrictions Weight Bearing Restrictions: No Pain: Pain Assessment Pain Assessment: 0-10 Pain Score: 7  Pain Type: Acute pain Pain Location: Rib cage Pain Orientation: Left Pain Descriptors / Indicators: Aching Pain Frequency: Constant Pain Onset: On-going Pain Intervention(s): Ambulation/increased activity      See Function Navigator for Current Functional Status.   Therapy/Group: Individual Therapy  Elray McgregorCynthia Wynn 09/19/2017, 1:19 PM

## 2017-09-19 NOTE — Discharge Instructions (Signed)
Inpatient Rehab Discharge Instructions  Daniel ChandlerRandy Patton Discharge date and time: No discharge date for patient encounter.   Activities/Precautions/ Functional Status: Activity: Back brace when out of bed Diet: regular diet Wound Care: keep wound clean and dry Functional status:  ___ No restrictions     ___ Walk up steps independently ___ 24/7 supervision/assistance   ___ Walk up steps with assistance ___ Intermittent supervision/assistance  ___ Bathe/dress independently ___ Walk with walker     __x_ Bathe/dress with assistance ___ Walk Independently    ___ Shower independently ___ Walk with assistance    ___ Shower with assistance ___ No alcohol     ___ Return to work/school ________     COMMUNITY REFERRALS UPON DISCHARGE:    Home Health:   PT     OT                      Agency:  [facility] Phone: [phone]   Medical Equipment/Items Ordered: rolling walker, tub bench                                                      Agency/Supplier:  [facility]      Special Instructions:    My questions have been answered and I understand these instructions. I will adhere to these goals and the provided educational materials after my discharge from the hospital.  Patient/Caregiver Signature _______________________________ Date __________  Clinician Signature _______________________________________ Date __________  Please bring this form and your medication list with you to all your follow-up doctor's appointments.
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
## 2017-09-20 LAB — HEMOGLOBIN AND HEMATOCRIT, BLOOD
HCT: 29.5 % — ABNORMAL LOW (ref 39.0–52.0)
HEMOGLOBIN: 9.1 g/dL — AB (ref 13.0–17.0)

## 2017-09-20 MED ORDER — METHOCARBAMOL 500 MG PO TABS: 500.0000 mg | ORAL_TABLET | Freq: Four times a day (QID) | ORAL | 0 refills | Status: DC

## 2017-09-20 MED ORDER — OXYCODONE HCL 5 MG PO TABS: 5.0000 mg | ORAL_TABLET | ORAL | 0 refills | Status: DC | PRN

## 2017-09-20 NOTE — Progress Notes (Signed)
Social Work Discharge Note  The overall goal for the admission was met for:   Discharge location: Yes - home with wife who can provide 24/7 assistance  Length of Stay: Yes - 10 days  Discharge activity level: Yes - modified independent  Home/community participation: Yes  Services provided included: MD, RD, PT, OT, SLP, RN, TR, Pharmacy, [facility]: Private Insurance: [employer]   Follow-up services arranged: Home Health: PT, OT via [place], DME: rolling walker, tub bench via [place] and Patient/Family has no preference for HH/DME agencies  Comments (or additional information):  Patient/Family verbalized understanding of follow-up arrangements: Yes  Individual responsible for coordination of the follow-up plan: pt  Confirmed correct DME delivered: Laketha Leopard 09/20/2017    Leroy Pettway
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
## 2017-09-20 NOTE — Progress Notes (Signed)
Patient left facility via private vehicle with wife in attendance. Education provided. No questions/concerns voiced.

## 2017-10-02 ENCOUNTER — Other Ambulatory Visit: Payer: Self-pay

## 2017-10-02 NOTE — Telephone Encounter (Signed)
Patients wife called stating that patient only has 3 tablets left of his oxycodone and needs a refill.

## 2017-10-03 ENCOUNTER — Other Ambulatory Visit: Payer: Self-pay | Admitting: Neurosurgery

## 2017-10-03 MED ORDER — OXYCODONE HCL 5 MG PO TABS: 5.0000 mg | ORAL_TABLET | Freq: Four times a day (QID) | ORAL | 0 refills | Status: DC | PRN

## 2017-10-03 NOTE — Telephone Encounter (Signed)
Hospital discharge 09/21/2017, Dr. Riley KillSwartz out on vacation...please advise

## 2017-10-03 NOTE — Telephone Encounter (Signed)
Fill date 09/20/17 # 40.  Next appt is 10/30/17.  Daniel Patton will have to do a bridge to an appt 10/09/17.

## 2017-10-03 NOTE — Telephone Encounter (Signed)
I left a message for Mrs Larinda ButteryJessup to call office back.  [name] will sign an Rx for oxycodone 1 q 6 hours # 20 (5 days) and he will need to be seen 10/09/17 @ 1:30 (2pm appt) by Riley LamEunice for further refill.  Last fill was 09/20/17 #40

## 2017-10-04 NOTE — Telephone Encounter (Signed)
3rd attempt to reach Mr or Mrs Daniel Patton about the oxycodone request.  [name] signed an bridge rx and we have made him an appt on 10/10/27 with eunice but I have not been able to relay information since there is not name on VM.

## 2017-10-09 ENCOUNTER — Encounter: Payer: BC Managed Care – PPO | Attending: Registered Nurse | Admitting: Registered Nurse

## 2017-10-09 NOTE — Telephone Encounter (Signed)
RX WAS NOT PICKED UP (rx destroyed).  Daniel Patton never returned our calls and did not come for appt today.  He is still scheduled to see DR Riley KillSwartz on 10/30/17.

## 2017-10-11 ENCOUNTER — Telehealth: Payer: Self-pay

## 2017-10-11 NOTE — Pre-Procedure Instructions (Signed)
Danie ChandlerRandy App  10/11/2017      [id number] [place], [place] - [address] [place] [id number] Phone: [phone] Fax: [phone]  [facility] [place] - [id number] AT Portal to Reg[facility] [address] [place] [id number] Phone: [phone] Fax: [phone]    Your procedure is scheduled on Thursday, October 17, 2017  [facility] "A" at 5:30AM   Call this number if you have problems the morning of surgery:  [phone]   Remember:  Do not eat food or drink liquids after midnight.  Take these medicines the morning of surgery with A SIP OF WATER: AmLODipine (NORVASC) and Omeprazole (PRILOSEC). If needed OxyCODONE (OXY IR/ROXICODONE) for pain, Ondansetron (ZOFRAN) for nausea, and Methocarbamol (ROBAXIN) for spasms.  As of today, stop taking all Aspirins, Vitamins, Fish oils, and Herbal medications. Also stop all NSAIDS i.e. Advil, Ibuprofen, Motrin, Aleve, Anaprox, Naproxen, BC and Goody Powders.   Do not wear jewelry.  Do not wear lotions, powders, colognes, or deodorant.  Do not shave 48 hours prior to surgery.  Men may shave face and neck.  Do not bring valuables to the hospital.  [facility] is not responsible for any belongings or valuables.  Contacts, dentures or bridgework may not be worn into surgery.  Leave your suitcase in the car.  After surgery it may be brought to your room.  For patients admitted to the hospital, discharge time will be determined by your treatment team.  Patients discharged the day of surgery will not be allowed to drive home.   Special instructions:   Halfway- Preparing For Surgery  Before surgery, you can play an important role. Because skin is not sterile, your skin needs to be as free of germs as possible. You can reduce the number of germs on your skin by washing with CHG (chlorahexidine gluconate)  Soap before surgery.  CHG is an antiseptic cleaner which kills germs and bonds with the skin to continue killing germs even after washing.  Please do not use if you have an allergy to CHG or antibacterial soaps. If your skin becomes reddened/irritated stop using the CHG.  Do not shave (including legs and underarms) for at least 48 hours prior to first CHG shower. It is OK to shave your face.  Please follow these instructions carefully.   1. Shower the NIGHT BEFORE SURGERY and the MORNING OF SURGERY with CHG.   2. If you chose to wash your hair, wash your hair first as usual with your normal shampoo.  3. After you shampoo, rinse your hair and body thoroughly to remove the shampoo.  4. Use CHG as you would any other liquid soap. You can apply CHG directly to the skin and wash gently with a scrungie or a clean washcloth.   5. Apply the CHG Soap to your body ONLY FROM THE NECK DOWN.  Do not use on open wounds or open sores. Avoid contact with your eyes, ears, mouth and genitals (private parts). Wash Face and genitals (private parts)  with your normal soap.  6. Wash thoroughly, paying special attention to the area where your surgery will be performed.  7. Thoroughly rinse your body with warm water from the neck down.  8. DO NOT shower/wash with your normal soap after using and rinsing off the CHG Soap.  9. Pat yourself dry with a CLEAN TOWEL.  10.  Wear CLEAN PAJAMAS to bed the night before surgery, wear comfortable clothes the morning of surgery  11. Place CLEAN SHEETS on your bed the night of your first shower and DO NOT SLEEP WITH PETS.  Day of Surgery: Do not apply any deodorants/lotions. Please wear clean clothes to the hospital/surgery center.    Please read over the following fact sheets that you were given. Pain Booklet, Coughing and Deep Breathing, MRSA Information and Surgical Site Infection Prevention

## 2017-10-11 NOTE — Telephone Encounter (Signed)
That would be fine 

## 2017-10-11 NOTE — Telephone Encounter (Signed)
[facility] is requesting verbal orders for 1 time a wk for 2 wks until patient has surgery on his back.

## 2017-10-11 NOTE — Telephone Encounter (Signed)
Bruce notified.

## 2017-10-14 ENCOUNTER — Encounter (HOSPITAL_COMMUNITY)
Admission: RE | Admit: 2017-10-14 | Discharge: 2017-10-14 | Disposition: A | Discharge status: Home / Self Care | Payer: BC Managed Care – PPO | Source: Ambulatory Visit | Attending: Neurosurgery | Admitting: Neurosurgery

## 2017-10-14 ENCOUNTER — Encounter (HOSPITAL_COMMUNITY): Payer: Self-pay

## 2017-10-14 HISTORY — DX: Sleep apnea, unspecified: G47.30

## 2017-10-14 HISTORY — DX: Essential (primary) hypertension: I10

## 2017-10-14 HISTORY — DX: Gastro-esophageal reflux disease without esophagitis: K21.9

## 2017-10-14 LAB — CBC
HCT: 37.3 % — ABNORMAL LOW (ref 39.0–52.0)
Hemoglobin: 11.7 g/dL — ABNORMAL LOW (ref 13.0–17.0)
MCH: 28 pg (ref 26.0–34.0)
MCHC: 31.4 g/dL (ref 30.0–36.0)
MCV: 89.2 fL (ref 78.0–100.0)
PLATELETS: 377 10*3/uL (ref 150–400)
RBC: 4.18 MIL/uL — AB (ref 4.22–5.81)
RDW: 14.1 % (ref 11.5–15.5)
WBC: 12.4 10*3/uL — AB (ref 4.0–10.5)

## 2017-10-14 LAB — BASIC METABOLIC PANEL
Anion gap: 9 (ref 5–15)
BUN: 14 mg/dL (ref 6–20)
CO2: 28 mmol/L (ref 22–32)
Calcium: 9.7 mg/dL (ref 8.9–10.3)
Chloride: 99 mmol/L — ABNORMAL LOW (ref 101–111)
Creatinine, Ser: 1.9 mg/dL — ABNORMAL HIGH (ref 0.61–1.24)
GFR calc Af Amer: 42 mL/min — ABNORMAL LOW (ref >60)
GFR, EST NON AFRICAN AMERICAN: 36 mL/min — AB (ref >60)
Glucose, Bld: 112 mg/dL — ABNORMAL HIGH (ref 65–99)
Potassium: 4.4 mmol/L (ref 3.5–5.1)
SODIUM: 136 mmol/L (ref 135–145)

## 2017-10-14 LAB — ABO/RH: ABO/RH(D): A POS

## 2017-10-14 LAB — SURGICAL PCR SCREEN
MRSA, PCR: NEGATIVE
Staphylococcus aureus: POSITIVE — AB

## 2017-10-14 LAB — TYPE AND SCREEN
ABO/RH(D): A POS
ANTIBODY SCREEN: NEGATIVE

## 2017-10-16 DIAGNOSIS — I82442 Acute embolism and thrombosis of left tibial vein: Secondary | ICD-10-CM | POA: Diagnosis not present

## 2017-10-16 DIAGNOSIS — N183 Chronic kidney disease, stage 3 (moderate): Secondary | ICD-10-CM | POA: Diagnosis not present

## 2017-10-16 DIAGNOSIS — S32009D Unspecified fracture of unspecified lumbar vertebra, subsequent encounter for fracture with routine healing: Secondary | ICD-10-CM | POA: Diagnosis not present

## 2017-10-16 DIAGNOSIS — I129 Hypertensive chronic kidney disease with stage 1 through stage 4 chronic kidney disease, or unspecified chronic kidney disease: Secondary | ICD-10-CM | POA: Diagnosis not present

## 2017-10-16 DIAGNOSIS — S36029D Unspecified contusion of spleen, subsequent encounter: Secondary | ICD-10-CM | POA: Diagnosis not present

## 2017-10-16 DIAGNOSIS — A499 Bacterial infection, unspecified: Secondary | ICD-10-CM | POA: Diagnosis not present

## 2017-10-16 DIAGNOSIS — S2242XD Multiple fractures of ribs, left side, subsequent encounter for fracture with routine healing: Secondary | ICD-10-CM | POA: Diagnosis not present

## 2017-10-16 DIAGNOSIS — D62 Acute posthemorrhagic anemia: Secondary | ICD-10-CM | POA: Diagnosis not present

## 2017-10-16 DIAGNOSIS — S060X9D Concussion with loss of consciousness of unspecified duration, subsequent encounter: Secondary | ICD-10-CM | POA: Diagnosis not present

## 2017-10-16 DIAGNOSIS — J9819 Other pulmonary collapse: Secondary | ICD-10-CM | POA: Diagnosis not present

## 2017-10-16 DIAGNOSIS — N390 Urinary tract infection, site not specified: Secondary | ICD-10-CM | POA: Diagnosis not present

## 2017-10-16 DIAGNOSIS — S272XXD Traumatic hemopneumothorax, subsequent encounter: Secondary | ICD-10-CM | POA: Diagnosis not present

## 2017-10-16 NOTE — Anesthesia Preprocedure Evaluation (Addendum)
Anesthesia Evaluation  Patient identified by MRN, date of birth, ID band Patient awake    Reviewed: Allergy & Precautions, NPO status , Patient's Chart, lab work & pertinent test results  Airway Mallampati: II  TM Distance: >3 FB Neck ROM: Full    Dental  (+) Dental Advisory Given, Teeth Intact   Pulmonary sleep apnea and Continuous Positive Airway Pressure Ventilation , neg COPD,    Pulmonary exam normal breath sounds clear to auscultation       Cardiovascular Exercise Tolerance: Good hypertension, Pt. on medications Normal cardiovascular exam Rhythm:Regular Rate:Normal     Neuro/Psych negative neurological ROS  negative psych ROS   GI/Hepatic Neg liver ROS, GERD  Controlled and Medicated,  Endo/Other  negative endocrine ROSneg diabetes  Renal/GU Renal InsufficiencyRenal disease  negative genitourinary   Musculoskeletal  (+) Arthritis ,   Abdominal   Peds  Hematology negative hematology ROS (+)   Anesthesia Other Findings   Reproductive/Obstetrics                           Anesthesia Physical Anesthesia Plan  ASA: II  Anesthesia Plan: General   Post-op Pain Management:    Induction: Intravenous  PONV Risk Score and Plan: 3 and Treatment may vary due to age or medical condition, Ondansetron, Dexamethasone and Midazolam  Airway Management Planned: Oral ETT  Additional Equipment: None  Intra-op Plan:   Post-operative Plan: Extubation in OR  Informed Consent: I have reviewed the patients History and Physical, chart, labs and discussed the procedure including the risks, benefits and alternatives for the proposed anesthesia with the patient or authorized representative who has indicated his/her understanding and acceptance.   Dental advisory given  Plan Discussed with: CRNA  Anesthesia Plan Comments:         Anesthesia Quick Evaluation

## 2017-10-17 ENCOUNTER — Inpatient Hospital Stay (HOSPITAL_COMMUNITY)
Admission: RE | Admit: 2017-10-17 | Discharge: 2017-10-20 | DRG: 458 | Disposition: A | Discharge status: Home / Self Care | Payer: BC Managed Care – PPO | Source: Ambulatory Visit | Attending: Neurosurgery | Admitting: Neurosurgery

## 2017-10-17 ENCOUNTER — Inpatient Hospital Stay (HOSPITAL_COMMUNITY): Payer: BC Managed Care – PPO

## 2017-10-17 ENCOUNTER — Inpatient Hospital Stay (HOSPITAL_COMMUNITY): Payer: BC Managed Care – PPO | Admitting: Anesthesiology

## 2017-10-17 ENCOUNTER — Encounter (HOSPITAL_COMMUNITY): Payer: Self-pay | Admitting: Urology

## 2017-10-17 ENCOUNTER — Other Ambulatory Visit: Payer: Self-pay

## 2017-10-17 ENCOUNTER — Encounter (HOSPITAL_COMMUNITY)
Admission: RE | Disposition: A | Discharge status: Home / Self Care | Payer: Self-pay | Source: Ambulatory Visit | Attending: Neurosurgery

## 2017-10-17 DIAGNOSIS — S34109A Unspecified injury to unspecified level of lumbar spinal cord, initial encounter: Secondary | ICD-10-CM

## 2017-10-17 DIAGNOSIS — Z882 Allergy status to sulfonamides status: Secondary | ICD-10-CM

## 2017-10-17 DIAGNOSIS — S32029A Unspecified fracture of second lumbar vertebra, initial encounter for closed fracture: Secondary | ICD-10-CM | POA: Diagnosis present

## 2017-10-17 DIAGNOSIS — K219 Gastro-esophageal reflux disease without esophagitis: Secondary | ICD-10-CM | POA: Diagnosis present

## 2017-10-17 DIAGNOSIS — Y92410 Unspecified street and highway as the place of occurrence of the external cause: Secondary | ICD-10-CM | POA: Diagnosis not present

## 2017-10-17 DIAGNOSIS — M549 Dorsalgia, unspecified: Secondary | ICD-10-CM | POA: Diagnosis present

## 2017-10-17 DIAGNOSIS — S32008A Other fracture of unspecified lumbar vertebra, initial encounter for closed fracture: Secondary | ICD-10-CM | POA: Diagnosis present

## 2017-10-17 DIAGNOSIS — Z881 Allergy status to other antibiotic agents status: Secondary | ICD-10-CM | POA: Diagnosis not present

## 2017-10-17 DIAGNOSIS — Z79899 Other long term (current) drug therapy: Secondary | ICD-10-CM

## 2017-10-17 DIAGNOSIS — Z91013 Allergy to seafood: Secondary | ICD-10-CM

## 2017-10-17 DIAGNOSIS — S32019A Unspecified fracture of first lumbar vertebra, initial encounter for closed fracture: Secondary | ICD-10-CM | POA: Diagnosis present

## 2017-10-17 DIAGNOSIS — Z880 Allergy status to penicillin: Secondary | ICD-10-CM

## 2017-10-17 DIAGNOSIS — I10 Essential (primary) hypertension: Secondary | ICD-10-CM | POA: Diagnosis present

## 2017-10-17 DIAGNOSIS — G4730 Sleep apnea, unspecified: Secondary | ICD-10-CM | POA: Diagnosis present

## 2017-10-17 DIAGNOSIS — Z419 Encounter for procedure for purposes other than remedying health state, unspecified: Secondary | ICD-10-CM

## 2017-10-17 HISTORY — PX: THORACIC FUSION: SHX1062

## 2017-10-17 SURGERY — POSTERIOR LUMBAR FUSION 1 LEVEL
Anesthesia: General | Site: Back

## 2017-10-17 MED ORDER — CEFAZOLIN SODIUM-DEXTROSE 2-4 GM/100ML-% IV SOLN
2.0000 g | INTRAVENOUS | Status: AC
  Administered 2017-10-17: 2 g via INTRAVENOUS

## 2017-10-17 MED ORDER — OXYCODONE HCL 5 MG PO TABS
5.0000 mg | ORAL_TABLET | Freq: Once | ORAL | Status: AC | PRN
  Administered 2017-10-17: 5 mg via ORAL

## 2017-10-17 MED ORDER — FENTANYL CITRATE (PF) 100 MCG/2ML IJ SOLN
25.0000 ug | INTRAMUSCULAR | Status: DC | PRN
  Administered 2017-10-17 (×2): 50 ug via INTRAVENOUS

## 2017-10-17 MED ORDER — LIDOCAINE 2% (20 MG/ML) 5 ML SYRINGE
INTRAMUSCULAR | Status: AC
  Filled 2017-10-17: qty 5

## 2017-10-17 MED ORDER — SUGAMMADEX SODIUM 200 MG/2ML IV SOLN
INTRAVENOUS | Status: DC | PRN
  Administered 2017-10-17: 200 mg via INTRAVENOUS

## 2017-10-17 MED ORDER — PROPOFOL 10 MG/ML IV BOLUS
INTRAVENOUS | Status: DC | PRN
  Administered 2017-10-17: 180 mg via INTRAVENOUS

## 2017-10-17 MED ORDER — ACETAMINOPHEN 650 MG RE SUPP: 650.0000 mg | RECTAL | Status: DC | PRN

## 2017-10-17 MED ORDER — VITAMIN D 1000 UNITS PO TABS
2000.0000 [IU] | ORAL_TABLET | Freq: Every day | ORAL | Status: DC
  Administered 2017-10-18 – 2017-10-20 (×3): 2000 [IU] via ORAL
  Filled 2017-10-17 (×4): qty 2

## 2017-10-17 MED ORDER — OXYCODONE HCL 5 MG PO TABS
10.0000 mg | ORAL_TABLET | ORAL | Status: DC | PRN
  Administered 2017-10-17 – 2017-10-18 (×6): 10 mg via ORAL
  Filled 2017-10-17 (×6): qty 2

## 2017-10-17 MED ORDER — MUPIROCIN 2 % EX OINT
TOPICAL_OINTMENT | CUTANEOUS | Status: AC
  Administered 2017-10-17: 1 via TOPICAL
  Filled 2017-10-17: qty 22

## 2017-10-17 MED ORDER — ALUM & MAG HYDROXIDE-SIMETH 200-200-20 MG/5ML PO SUSP: 30.0000 mL | Freq: Four times a day (QID) | ORAL | Status: DC | PRN

## 2017-10-17 MED ORDER — LIDOCAINE-EPINEPHRINE 1 %-1:100000 IJ SOLN
INTRAMUSCULAR | Status: DC | PRN
  Administered 2017-10-17: 10 mL via INTRADERMAL

## 2017-10-17 MED ORDER — ACETAMINOPHEN 325 MG PO TABS: 650.0000 mg | ORAL_TABLET | ORAL | Status: DC | PRN

## 2017-10-17 MED ORDER — MUPIROCIN 2 % EX OINT
1.0000 "application " | TOPICAL_OINTMENT | Freq: Two times a day (BID) | CUTANEOUS | Status: DC
  Administered 2017-10-17: 1 via TOPICAL

## 2017-10-17 MED ORDER — ACETAMINOPHEN 10 MG/ML IV SOLN
1000.0000 mg | INTRAVENOUS | Status: AC
  Administered 2017-10-17: 1000 mg via INTRAVENOUS
  Filled 2017-10-17: qty 100

## 2017-10-17 MED ORDER — LIDOCAINE 2% (20 MG/ML) 5 ML SYRINGE
INTRAMUSCULAR | Status: DC | PRN
  Administered 2017-10-17: 100 mg via INTRAVENOUS

## 2017-10-17 MED ORDER — PROPOFOL 10 MG/ML IV BOLUS
INTRAVENOUS | Status: AC
  Filled 2017-10-17: qty 20

## 2017-10-17 MED ORDER — GELATIN ABSORBABLE MT POWD
OROMUCOSAL | Status: DC | PRN
  Administered 2017-10-17: 09:00:00 via TOPICAL

## 2017-10-17 MED ORDER — ONDANSETRON HCL 4 MG PO TABS: 4.0000 mg | ORAL_TABLET | Freq: Four times a day (QID) | ORAL | Status: DC | PRN

## 2017-10-17 MED ORDER — CYCLOBENZAPRINE HCL 10 MG PO TABS
10.0000 mg | ORAL_TABLET | Freq: Three times a day (TID) | ORAL | Status: DC | PRN
  Administered 2017-10-17 – 2017-10-18 (×3): 10 mg via ORAL
  Filled 2017-10-17 (×2): qty 1

## 2017-10-17 MED ORDER — OXYCODONE HCL 5 MG/5ML PO SOLN: 5.0000 mg | Freq: Once | ORAL | Status: AC | PRN

## 2017-10-17 MED ORDER — EPHEDRINE 5 MG/ML INJ
INTRAVENOUS | Status: AC
  Filled 2017-10-17: qty 10

## 2017-10-17 MED ORDER — THROMBIN (RECOMBINANT) 5000 UNITS EX SOLR
CUTANEOUS | Status: AC
  Filled 2017-10-17: qty 5000

## 2017-10-17 MED ORDER — TURMERIC CURCUMIN 500 MG PO CAPS: 1000.0000 mg | ORAL_CAPSULE | Freq: Every day | ORAL | Status: DC

## 2017-10-17 MED ORDER — BUPIVACAINE LIPOSOME 1.3 % IJ SUSP
20.0000 mL | Freq: Once | INTRAMUSCULAR | Status: DC
  Filled 2017-10-17: qty 20

## 2017-10-17 MED ORDER — OXYCODONE HCL 5 MG PO TABS
5.0000 mg | ORAL_TABLET | Freq: Four times a day (QID) | ORAL | Status: DC | PRN
  Administered 2017-10-18: 5 mg via ORAL
  Filled 2017-10-17 (×2): qty 1

## 2017-10-17 MED ORDER — ROCURONIUM BROMIDE 10 MG/ML (PF) SYRINGE
PREFILLED_SYRINGE | INTRAVENOUS | Status: AC
  Filled 2017-10-17: qty 5

## 2017-10-17 MED ORDER — OXYCODONE HCL 5 MG PO TABS
ORAL_TABLET | ORAL | Status: AC
  Administered 2017-10-17: 10 mg via ORAL
  Filled 2017-10-17: qty 1

## 2017-10-17 MED ORDER — ONDANSETRON HCL 4 MG/2ML IJ SOLN
INTRAMUSCULAR | Status: DC | PRN
  Administered 2017-10-17: 4 mg via INTRAVENOUS

## 2017-10-17 MED ORDER — METHOCARBAMOL 500 MG PO TABS
500.0000 mg | ORAL_TABLET | Freq: Four times a day (QID) | ORAL | Status: DC | PRN
  Administered 2017-10-17 – 2017-10-18 (×2): 500 mg via ORAL
  Filled 2017-10-17 (×3): qty 1

## 2017-10-17 MED ORDER — CYCLOBENZAPRINE HCL 10 MG PO TABS
ORAL_TABLET | ORAL | Status: AC
  Filled 2017-10-17: qty 1

## 2017-10-17 MED ORDER — BUPIVACAINE LIPOSOME 1.3 % IJ SUSP
INTRAMUSCULAR | Status: DC | PRN
  Administered 2017-10-17: 10 mL

## 2017-10-17 MED ORDER — ONDANSETRON HCL 4 MG PO TABS: 4.0000 mg | ORAL_TABLET | Freq: Three times a day (TID) | ORAL | Status: DC | PRN

## 2017-10-17 MED ORDER — DEXAMETHASONE SODIUM PHOSPHATE 10 MG/ML IJ SOLN
INTRAMUSCULAR | Status: DC | PRN
  Administered 2017-10-17: 10 mg via INTRAVENOUS

## 2017-10-17 MED ORDER — PANTOPRAZOLE SODIUM 40 MG IV SOLR
40.0000 mg | Freq: Every day | INTRAVENOUS | Status: DC
  Administered 2017-10-17: 40 mg via INTRAVENOUS
  Filled 2017-10-17: qty 40

## 2017-10-17 MED ORDER — PROMETHAZINE HCL 25 MG/ML IJ SOLN: 6.2500 mg | INTRAMUSCULAR | Status: DC | PRN

## 2017-10-17 MED ORDER — DEXAMETHASONE SODIUM PHOSPHATE 10 MG/ML IJ SOLN
INTRAMUSCULAR | Status: AC
  Filled 2017-10-17: qty 1

## 2017-10-17 MED ORDER — FENTANYL CITRATE (PF) 250 MCG/5ML IJ SOLN
INTRAMUSCULAR | Status: DC | PRN
  Administered 2017-10-17 (×3): 50 ug via INTRAVENOUS
  Administered 2017-10-17: 100 ug via INTRAVENOUS

## 2017-10-17 MED ORDER — ENSURE ENLIVE PO LIQD
237.0000 mL | Freq: Two times a day (BID) | ORAL | Status: DC
  Administered 2017-10-20: 237 mL via ORAL

## 2017-10-17 MED ORDER — CHLORHEXIDINE GLUCONATE CLOTH 2 % EX PADS: 6.0000 | MEDICATED_PAD | Freq: Once | CUTANEOUS | Status: DC

## 2017-10-17 MED ORDER — THROMBIN (RECOMBINANT) 20000 UNITS EX SOLR
CUTANEOUS | Status: AC
  Filled 2017-10-17: qty 20000

## 2017-10-17 MED ORDER — THROMBIN (RECOMBINANT) 20000 UNITS EX SOLR
CUTANEOUS | Status: DC | PRN
  Administered 2017-10-17: 09:00:00 via TOPICAL

## 2017-10-17 MED ORDER — SODIUM CHLORIDE 0.9 % IV SOLN: 250.0000 mL | INTRAVENOUS | Status: DC

## 2017-10-17 MED ORDER — DEXAMETHASONE SODIUM PHOSPHATE 10 MG/ML IJ SOLN: 10.0000 mg | INTRAMUSCULAR | Status: DC

## 2017-10-17 MED ORDER — EPHEDRINE SULFATE 50 MG/ML IJ SOLN
INTRAMUSCULAR | Status: DC | PRN
  Administered 2017-10-17: 5 mg via INTRAVENOUS

## 2017-10-17 MED ORDER — CEFAZOLIN SODIUM-DEXTROSE 2-4 GM/100ML-% IV SOLN
2.0000 g | Freq: Three times a day (TID) | INTRAVENOUS | Status: AC
  Administered 2017-10-17 – 2017-10-18 (×3): 2 g via INTRAVENOUS
  Filled 2017-10-17 (×3): qty 100

## 2017-10-17 MED ORDER — LIDOCAINE-EPINEPHRINE 1 %-1:100000 IJ SOLN
INTRAMUSCULAR | Status: AC
  Filled 2017-10-17: qty 1

## 2017-10-17 MED ORDER — MIDAZOLAM HCL 2 MG/2ML IJ SOLN
INTRAMUSCULAR | Status: AC
  Filled 2017-10-17: qty 2

## 2017-10-17 MED ORDER — PHENYLEPHRINE HCL 10 MG/ML IJ SOLN
INTRAVENOUS | Status: DC | PRN
  Administered 2017-10-17: 15 ug/min via INTRAVENOUS

## 2017-10-17 MED ORDER — PHENOL 1.4 % MT LIQD: 1.0000 | OROMUCOSAL | Status: DC | PRN

## 2017-10-17 MED ORDER — MENTHOL 3 MG MT LOZG: 1.0000 | LOZENGE | OROMUCOSAL | Status: DC | PRN

## 2017-10-17 MED ORDER — HYDROMORPHONE HCL 1 MG/ML IJ SOLN
0.5000 mg | INTRAMUSCULAR | Status: DC | PRN
  Administered 2017-10-17 – 2017-10-18 (×9): 0.5 mg via INTRAVENOUS
  Filled 2017-10-17 (×9): qty 0.5

## 2017-10-17 MED ORDER — SODIUM CHLORIDE 0.9% FLUSH: 3.0000 mL | INTRAVENOUS | Status: DC | PRN

## 2017-10-17 MED ORDER — ROCURONIUM BROMIDE 10 MG/ML (PF) SYRINGE
PREFILLED_SYRINGE | INTRAVENOUS | Status: DC | PRN
  Administered 2017-10-17: 20 mg via INTRAVENOUS
  Administered 2017-10-17: 60 mg via INTRAVENOUS
  Administered 2017-10-17: 20 mg via INTRAVENOUS

## 2017-10-17 MED ORDER — PHENYLEPHRINE HCL 10 MG/ML IJ SOLN
INTRAMUSCULAR | Status: DC | PRN
  Administered 2017-10-17 (×2): 120 ug via INTRAVENOUS

## 2017-10-17 MED ORDER — SODIUM CHLORIDE 0.9 % IR SOLN
Status: DC | PRN
  Administered 2017-10-17: 09:00:00

## 2017-10-17 MED ORDER — PHENYLEPHRINE 40 MCG/ML (10ML) SYRINGE FOR IV PUSH (FOR BLOOD PRESSURE SUPPORT)
PREFILLED_SYRINGE | INTRAVENOUS | Status: AC
  Filled 2017-10-17: qty 10

## 2017-10-17 MED ORDER — DIPHENHYDRAMINE HCL 25 MG PO CAPS
50.0000 mg | ORAL_CAPSULE | Freq: Every day | ORAL | Status: DC
  Administered 2017-10-17 – 2017-10-19 (×3): 50 mg via ORAL
  Filled 2017-10-17 (×3): qty 2

## 2017-10-17 MED ORDER — 0.9 % SODIUM CHLORIDE (POUR BTL) OPTIME
TOPICAL | Status: DC | PRN
Start: 2017-10-17 — End: 2017-10-17
  Administered 2017-10-17 (×3): 1000 mL

## 2017-10-17 MED ORDER — ONDANSETRON HCL 4 MG/2ML IJ SOLN: 4.0000 mg | Freq: Four times a day (QID) | INTRAMUSCULAR | Status: DC | PRN

## 2017-10-17 MED ORDER — ADULT MULTIVITAMIN W/MINERALS CH
1.0000 | ORAL_TABLET | Freq: Every day | ORAL | Status: DC
  Administered 2017-10-18 – 2017-10-20 (×3): 1 via ORAL
  Filled 2017-10-17 (×3): qty 1

## 2017-10-17 MED ORDER — MIDAZOLAM HCL 2 MG/2ML IJ SOLN
INTRAMUSCULAR | Status: DC | PRN
  Administered 2017-10-17: 2 mg via INTRAVENOUS

## 2017-10-17 MED ORDER — LACTATED RINGERS IV SOLN
INTRAVENOUS | Status: DC | PRN
  Administered 2017-10-17 (×2): via INTRAVENOUS

## 2017-10-17 MED ORDER — ARTIFICIAL TEARS OPHTHALMIC OINT
TOPICAL_OINTMENT | OPHTHALMIC | Status: AC
  Filled 2017-10-17: qty 3.5

## 2017-10-17 MED ORDER — BUPIVACAINE HCL (PF) 0.25 % IJ SOLN
INTRAMUSCULAR | Status: AC
  Filled 2017-10-17: qty 30

## 2017-10-17 MED ORDER — PANTOPRAZOLE SODIUM 40 MG PO TBEC: 40.0000 mg | DELAYED_RELEASE_TABLET | Freq: Every day | ORAL | Status: DC

## 2017-10-17 MED ORDER — VANCOMYCIN HCL 1000 MG IV SOLR
INTRAVENOUS | Status: AC
  Filled 2017-10-17: qty 1000

## 2017-10-17 MED ORDER — ONDANSETRON HCL 4 MG/2ML IJ SOLN
INTRAMUSCULAR | Status: AC
  Filled 2017-10-17: qty 2

## 2017-10-17 MED ORDER — AMLODIPINE BESYLATE 5 MG PO TABS
5.0000 mg | ORAL_TABLET | Freq: Every day | ORAL | Status: DC
  Administered 2017-10-18 – 2017-10-20 (×3): 5 mg via ORAL
  Filled 2017-10-17 (×3): qty 1

## 2017-10-17 MED ORDER — VANCOMYCIN HCL 1000 MG IV SOLR
INTRAVENOUS | Status: DC | PRN
  Administered 2017-10-17: 1000 mg via TOPICAL

## 2017-10-17 MED ORDER — FENTANYL CITRATE (PF) 100 MCG/2ML IJ SOLN
INTRAMUSCULAR | Status: AC
  Administered 2017-10-17: 50 ug via INTRAVENOUS
  Filled 2017-10-17: qty 2

## 2017-10-17 MED ORDER — FENTANYL CITRATE (PF) 250 MCG/5ML IJ SOLN
INTRAMUSCULAR | Status: AC
  Filled 2017-10-17: qty 5

## 2017-10-17 MED ORDER — SODIUM CHLORIDE 0.9% FLUSH
3.0000 mL | Freq: Two times a day (BID) | INTRAVENOUS | Status: DC
  Administered 2017-10-17 – 2017-10-20 (×6): 3 mL via INTRAVENOUS

## 2017-10-17 MED ORDER — SUGAMMADEX SODIUM 200 MG/2ML IV SOLN
INTRAVENOUS | Status: AC
  Filled 2017-10-17: qty 2

## 2017-10-17 MED ORDER — CEFAZOLIN SODIUM-DEXTROSE 2-4 GM/100ML-% IV SOLN
INTRAVENOUS | Status: AC
  Filled 2017-10-17: qty 100

## 2017-10-17 SURGICAL SUPPLY — 79 items
BAG DECANTER FOR FLEXI CONT (MISCELLANEOUS) ×3 IMPLANT
BENZOIN TINCTURE PRP APPL 2/3 (GAUZE/BANDAGES/DRESSINGS) ×3 IMPLANT
BLADE CLIPPER SURG (BLADE) IMPLANT
BLADE SURG 11 STRL SS (BLADE) ×3 IMPLANT
BONE VIVIGEN FORMABLE 10CC (Bone Implant) ×6 IMPLANT
BUR CUTTER 7.0 ROUND (BURR) ×3 IMPLANT
BUR MATCHSTICK NEURO 3.0 LAGG (BURR) ×3 IMPLANT
CANISTER SUCT 3000ML PPV (MISCELLANEOUS) ×3 IMPLANT
CAP LOCKING THREADED (Cap) ×36 IMPLANT
CARTRIDGE OIL MAESTRO DRILL (MISCELLANEOUS) ×1 IMPLANT
CLOSURE WOUND 1/2 X4 (GAUZE/BANDAGES/DRESSINGS) ×1
CONT SPEC 4OZ CLIKSEAL STRL BL (MISCELLANEOUS) ×3 IMPLANT
COVER BACK TABLE 60X90IN (DRAPES) ×3 IMPLANT
DECANTER SPIKE VIAL GLASS SM (MISCELLANEOUS) ×3 IMPLANT
DERMABOND ADVANCED (GAUZE/BANDAGES/DRESSINGS) ×2
DERMABOND ADVANCED .7 DNX12 (GAUZE/BANDAGES/DRESSINGS) ×1 IMPLANT
DIFFUSER DRILL AIR PNEUMATIC (MISCELLANEOUS) ×3 IMPLANT
DRAPE C-ARM 42X72 X-RAY (DRAPES) ×6 IMPLANT
DRAPE C-ARMOR (DRAPES) ×3 IMPLANT
DRAPE HALF SHEET 40X57 (DRAPES) IMPLANT
DRAPE LAPAROTOMY 100X72X124 (DRAPES) ×3 IMPLANT
DRAPE POUCH INSTRU U-SHP 10X18 (DRAPES) ×3 IMPLANT
DRAPE SURG 17X23 STRL (DRAPES) ×3 IMPLANT
DRSG OPSITE POSTOP 4X10 (GAUZE/BANDAGES/DRESSINGS) ×3 IMPLANT
DURAPREP 26ML APPLICATOR (WOUND CARE) ×3 IMPLANT
ELECT REM PT RETURN 9FT ADLT (ELECTROSURGICAL) ×3
ELECTRODE REM PT RTRN 9FT ADLT (ELECTROSURGICAL) ×1 IMPLANT
EVACUATOR 3/16  PVC DRAIN (DRAIN) ×2
EVACUATOR 3/16 PVC DRAIN (DRAIN) ×1 IMPLANT
FIBER BOAT ALLOFUSE 15CC (Bone Implant) ×3 IMPLANT
FIBER BOAT ALLOFUSE 7.5CC (Bone Implant) ×6 IMPLANT
GAUZE SPONGE 4X4 12PLY STRL (GAUZE/BANDAGES/DRESSINGS) ×3 IMPLANT
GAUZE SPONGE 4X4 16PLY XRAY LF (GAUZE/BANDAGES/DRESSINGS) ×9 IMPLANT
GLOVE BIO SURGEON STRL SZ7 (GLOVE) IMPLANT
GLOVE BIO SURGEON STRL SZ8 (GLOVE) ×6 IMPLANT
GLOVE BIOGEL PI IND STRL 7.0 (GLOVE) IMPLANT
GLOVE BIOGEL PI INDICATOR 7.0 (GLOVE)
GLOVE ECLIPSE 7.5 STRL STRAW (GLOVE) IMPLANT
GLOVE EXAM NITRILE LRG STRL (GLOVE) IMPLANT
GLOVE EXAM NITRILE XL STR (GLOVE) IMPLANT
GLOVE EXAM NITRILE XS STR PU (GLOVE) IMPLANT
GLOVE INDICATOR 8.5 STRL (GLOVE) ×6 IMPLANT
GOWN STRL REUS W/ TWL LRG LVL3 (GOWN DISPOSABLE) IMPLANT
GOWN STRL REUS W/ TWL XL LVL3 (GOWN DISPOSABLE) ×2 IMPLANT
GOWN STRL REUS W/TWL 2XL LVL3 (GOWN DISPOSABLE) IMPLANT
GOWN STRL REUS W/TWL LRG LVL3 (GOWN DISPOSABLE)
GOWN STRL REUS W/TWL XL LVL3 (GOWN DISPOSABLE) ×4
HEMOSTAT POWDER KIT SURGIFOAM (HEMOSTASIS) ×3 IMPLANT
KIT BASIN OR (CUSTOM PROCEDURE TRAY) ×3 IMPLANT
KIT ROOM TURNOVER OR (KITS) ×3 IMPLANT
MILL MEDIUM DISP (BLADE) ×3 IMPLANT
NEEDLE HYPO 21X1 ECLIPSE (NEEDLE) ×3 IMPLANT
NEEDLE HYPO 25X1 1.5 SAFETY (NEEDLE) ×3 IMPLANT
NS IRRIG 1000ML POUR BTL (IV SOLUTION) ×3 IMPLANT
OIL CARTRIDGE MAESTRO DRILL (MISCELLANEOUS) ×3
PACK LAMINECTOMY NEURO (CUSTOM PROCEDURE TRAY) ×3 IMPLANT
PAD ARMBOARD 7.5X6 YLW CONV (MISCELLANEOUS) ×9 IMPLANT
PEN SKIN MARKING BROAD (MISCELLANEOUS) ×3 IMPLANT
ROD SPINAL 5.5X500 COBALT (Rod) ×2 IMPLANT
ROD SPINAL 5.5X500 COBALT LF (Rod) ×1 IMPLANT
SCREW AMP MODULAR CREO 6.5X45 (Screw) ×18 IMPLANT
SCREW CREO 5.5X40 (Screw) ×12 IMPLANT
SCREW CREO MOD AMP 5.5X45MM (Screw) ×6 IMPLANT
SCREW PA THRD CREO TULIP 5.5X4 (Head) ×36 IMPLANT
SPONGE LAP 4X18 X RAY DECT (DISPOSABLE) IMPLANT
SPONGE SURGIFOAM ABS GEL 100 (HEMOSTASIS) ×3 IMPLANT
STRIP CLOSURE SKIN 1/2X4 (GAUZE/BANDAGES/DRESSINGS) ×2 IMPLANT
SUT VIC AB 0 CT1 18XCR BRD8 (SUTURE) ×2 IMPLANT
SUT VIC AB 0 CT1 8-18 (SUTURE) ×4
SUT VIC AB 2-0 CT1 18 (SUTURE) ×9 IMPLANT
SUT VIC AB 2-0 CT1 27 (SUTURE) ×2
SUT VIC AB 2-0 CT1 27XBRD (SUTURE) ×1 IMPLANT
SUT VIC AB 4-0 PS2 27 (SUTURE) ×3 IMPLANT
SYR CONTROL 10ML LL (SYRINGE) ×3 IMPLANT
SYRINGE 20CC LL (MISCELLANEOUS) ×3 IMPLANT
TOWEL GREEN STERILE (TOWEL DISPOSABLE) ×3 IMPLANT
TOWEL GREEN STERILE FF (TOWEL DISPOSABLE) ×3 IMPLANT
TRAY FOLEY W/METER SILVER 16FR (SET/KITS/TRAYS/PACK) ×3 IMPLANT
WATER STERILE IRR 1000ML POUR (IV SOLUTION) ×3 IMPLANT

## 2017-10-17 NOTE — Anesthesia Postprocedure Evaluation (Signed)
Anesthesia Post Note  Patient: Daniel ChandlerRandy Patton  Procedure(s) Performed: Posterior Lateral Fusion Thoracic Ten-Eleven, Thoracic Eleven-Twelve, Thoracic Twelve-Lumbar One, Lumbar One-Two, Lumbar Two-Three (N/A Back)     Patient location during evaluation: PACU Anesthesia Type: General Level of consciousness: awake and alert Pain management: pain level controlled Vital Signs Assessment: post-procedure vital signs reviewed and stable Respiratory status: spontaneous breathing, nonlabored ventilation, respiratory function stable and patient connected to nasal cannula oxygen Cardiovascular status: blood pressure returned to baseline and stable Postop Assessment: no apparent nausea or vomiting Anesthetic complications: no    Last Vitals:  Vitals:   10/17/17 1235 10/17/17 1244  BP: 103/72   Pulse: 99 99  Resp: 12 15  Temp:    SpO2: 99% 99%    Last Pain:  Vitals:   10/17/17 1230  PainSc: Asleep                 Beryle Lathehomas E Maryln Eastham
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
## 2017-10-17 NOTE — Anesthesia Procedure Notes (Signed)
Procedure Name: Intubation Date/Time: 10/17/2017 7:54 AM Performed by: Bryson Corona, CRNA Pre-anesthesia Checklist: Patient identified, Emergency Drugs available, Suction available and Patient being monitored Patient Re-evaluated:Patient Re-evaluated prior to induction Oxygen Delivery Method: Circle System Utilized Preoxygenation: Pre-oxygenation with 100% oxygen Induction Type: IV induction Ventilation: Oral airway inserted - appropriate to patient size Laryngoscope Size: Mac and 3 Grade View: Grade I Tube type: Oral Tube size: 7.0 mm Number of attempts: 1 Airway Equipment and Method: Stylet and Oral airway Placement Confirmation: ETT inserted through vocal cords under direct vision,  positive ETCO2 and breath sounds checked- equal and bilateral Secured at: 23 cm Tube secured with: Tape Dental Injury: Teeth and Oropharynx as per pre-operative assessment

## 2017-10-17 NOTE — Transfer of Care (Signed)
Immediate Anesthesia Transfer of Care Note  Patient: Daniel ChandlerRandy Patton  Procedure(s) Performed: Posterior Lateral Fusion Thoracic Ten-Eleven, Thoracic Eleven-Twelve, Thoracic Twelve-Lumbar One, Lumbar One-Two, Lumbar Two-Three (N/A Back)  Patient Location: PACU  Anesthesia Type:General  Level of Consciousness: drowsy  Airway & Oxygen Therapy: Patient Spontanous Breathing and Patient connected to face mask oxygen  Post-op Assessment: Report given to RN and Post -op Vital signs reviewed and stable  Post vital signs: Reviewed and stable  Last Vitals:  Vitals:   10/17/17 0614 10/17/17 0625  BP: (!) 132/105 (!) 121/96  Pulse: 95   Resp: 18   Temp: 36.6 C   SpO2: 97%     Last Pain: There were no vitals filed for this visit.       Complications: No apparent anesthesia complications
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
## 2017-10-18 MED ORDER — DEXAMETHASONE SODIUM PHOSPHATE 10 MG/ML IJ SOLN
10.0000 mg | Freq: Four times a day (QID) | INTRAMUSCULAR | Status: AC
  Administered 2017-10-18 – 2017-10-19 (×4): 10 mg via INTRAVENOUS
  Filled 2017-10-18 (×4): qty 1

## 2017-10-18 MED ORDER — DOCUSATE SODIUM 100 MG PO CAPS
100.0000 mg | ORAL_CAPSULE | Freq: Every day | ORAL | Status: DC
  Administered 2017-10-18 – 2017-10-20 (×3): 100 mg via ORAL
  Filled 2017-10-18 (×3): qty 1

## 2017-10-18 MED ORDER — DEXTROSE 5 % IV SOLN
1000.0000 mg | Freq: Four times a day (QID) | INTRAVENOUS | Status: AC
  Administered 2017-10-18 – 2017-10-19 (×4): 1000 mg via INTRAVENOUS
  Filled 2017-10-18 (×4): qty 10

## 2017-10-18 MED ORDER — PANTOPRAZOLE SODIUM 40 MG PO TBEC
40.0000 mg | DELAYED_RELEASE_TABLET | Freq: Every day | ORAL | Status: DC
  Administered 2017-10-18 – 2017-10-19 (×2): 40 mg via ORAL
  Filled 2017-10-18 (×2): qty 1

## 2017-10-18 MED ORDER — OXYCODONE HCL 5 MG PO TABS
15.0000 mg | ORAL_TABLET | ORAL | Status: DC | PRN
  Administered 2017-10-18 – 2017-10-20 (×5): 15 mg via ORAL
  Filled 2017-10-18 (×7): qty 3

## 2017-10-18 MED ORDER — SENNOSIDES-DOCUSATE SODIUM 8.6-50 MG PO TABS
1.0000 | ORAL_TABLET | Freq: Every day | ORAL | Status: DC
  Administered 2017-10-18 – 2017-10-19 (×2): 1 via ORAL
  Filled 2017-10-18 (×2): qty 1

## 2017-10-18 MED FILL — Sodium Chloride IV Soln 0.9%: INTRAVENOUS | Qty: 1000 | Status: AC

## 2017-10-18 MED FILL — Heparin Sodium (Porcine) Inj 1000 Unit/ML: INTRAMUSCULAR | Qty: 30 | Status: AC
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
## 2017-10-18 NOTE — Progress Notes (Signed)
MD called about pt pain management, new order received
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
## 2017-10-18 NOTE — Social Work (Signed)
CSW acknowledging PT note that pt would like to discharge home and PT is in agreement with plan due to pt functional level.   CSW signing off. Please consult if any additional needs arise.  Doy HutchingIsabel H Janah Mcculloh, LCSWA [facility] Clinical Social Work [phone]

## 2017-10-18 NOTE — Progress Notes (Signed)
Subjective: Patient reports doing really well pain is much better than it was preop he has gotten up this morning already denies any of his preoperative [place] pain no leg pain no new numbness or tingling  Objective: Vital signs in last 24 hours: Temp:  [97.8 F (36.6 C)-98.3 F (36.8 C)] 98 F (36.7 C) (01/11 0400) Pulse Rate:  [90-111] 103 (01/11 0600) Resp:  [8-24] 24 (01/11 0600) BP: (92-126)/(66-90) 126/90 (01/11 0400) SpO2:  [97 %-100 %] 99 % (01/11 0600)  Intake/Output from previous day: 01/10 0701 - 01/11 0700 In: 1200 [I.V.:1000; IV Piggyback:200] Out: 2585 [Urine:1810; Drains:575; Blood:200] Intake/Output this shift: No intake/output data recorded.  awake alert strength 5 out of 5 incision clean dry and intact  Lab Results: No results for input(s): WBC, HGB, HCT, PLT in the last 72 hours. BMET No results for input(s): NA, K, CL, CO2, GLUCOSE, BUN, CREATININE, CALCIUM in the last 72 hours.  Studies/Results: Dg Thoracolumabar Spine  Result Date: 10/17/2017 CLINICAL DATA:  Unstable L1-L2 injury. EXAM: DG C-ARM 61-120 MIN; THORACOLUMBAR SPINE - 2 VIEW COMPARISON:  Multiple priors. FINDINGS: C-arm films demonstrate pedicle screws placed in preparation for what is reported to be T10-L3 fusion. The completed fusion construct is not observed as part of these spot films. Furthermore, it is difficult to determine the level of pedicle screw placement, based on the limited field of view. IMPRESSION: Intraoperative films as described. Electronically Signed   By: Elsie StainJohn T Curnes M.D.   On: 10/17/2017 11:10   Dg C-arm 1-60 Min  Result Date: 10/17/2017 CLINICAL DATA:  Unstable L1-L2 injury. EXAM: DG C-ARM 61-120 MIN; THORACOLUMBAR SPINE - 2 VIEW COMPARISON:  Multiple priors. FINDINGS: C-arm films demonstrate pedicle screws placed in preparation for what is reported to be T10-L3 fusion. The completed fusion construct is not observed as part of these spot films. Furthermore, it is difficult  to determine the level of pedicle screw placement, based on the limited field of view. IMPRESSION: Intraoperative films as described. Electronically Signed   By: Elsie StainJohn T Curnes M.D.   On: 10/17/2017 11:10    Assessment/Plan: Postop day 1 T10 L3 stabilization doing very well will mobilized today with physical and occupational therapy  LOS: 1 day     Damia Bobrowski P 10/18/2017, 7:32 AM
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
## 2017-10-19 MED ORDER — CHLORHEXIDINE GLUCONATE CLOTH 2 % EX PADS: 6.0000 | MEDICATED_PAD | Freq: Every day | CUTANEOUS | Status: DC

## 2017-10-19 MED ORDER — MUPIROCIN 2 % EX OINT
1.0000 "application " | TOPICAL_OINTMENT | Freq: Two times a day (BID) | CUTANEOUS | Status: DC
  Administered 2017-10-19 – 2017-10-20 (×2): 1 via NASAL
  Filled 2017-10-19: qty 22

## 2017-10-19 MED ORDER — MAGNESIUM HYDROXIDE 400 MG/5ML PO SUSP
30.0000 mL | Freq: Every day | ORAL | Status: DC | PRN
  Administered 2017-10-19: 30 mL via ORAL
  Filled 2017-10-19: qty 30

## 2017-10-19 NOTE — Progress Notes (Signed)
Vitals:   10/19/17 0100 10/19/17 0400 10/19/17 0426 10/19/17 0808  BP:   129/85 124/85  Pulse: 90 86 88 100  Resp:      Temp:   98.4 F (36.9 C) 97.9 F (36.6 C)  TempSrc:   Oral Oral  SpO2: 98% 100% 98% 98%  Weight:      Height:        Patient resting in bed. Has been up and ambulating with the brace. Has continued over the past 24 hours to need both enteral and parenteral pain medications.  Plan: Patient I spoke this morning about trying to transition to exclusively an oral pain management regimen. Encouraged to ambulate at least 4 times in the halls today.  Hewitt ShortsNUDELMAN,ROBERT W, MD 10/19/2017, 8:52 AM
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
## 2017-10-20 MED ORDER — METHOCARBAMOL 500 MG PO TABS: 500.0000 mg | ORAL_TABLET | Freq: Four times a day (QID) | ORAL | 2 refills | Status: DC

## 2017-10-20 MED ORDER — OXYCODONE-ACETAMINOPHEN 7.5-325 MG PO TABS: 1.0000 | ORAL_TABLET | ORAL | 0 refills | Status: DC | PRN

## 2017-10-20 NOTE — Progress Notes (Signed)
PT Cancellation Note  Patient Details Name: Daniel ChandlerRandy Patton MRN: [id number] DOB: 03/19/1956   Cancelled Treatment:    Reason Eval/Treat Not Completed: Other (comment)(pt reports pain 5/10 and receiving pain meds with request to hold 30 min for pain meds to take effect. Will return)   [name] 10/20/2017, 8:36 AM  [name], PT [phone]

## 2017-10-20 NOTE — Progress Notes (Signed)
Patient doing well this am. No concerns Stable for d/c Drain removed
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
## 2017-10-20 NOTE — Discharge Summary (Signed)
Physician Discharge Summary  Patient ID: Daniel ChandlerRandy Patton MRN: [id number] DOB/AGE: 63/04/1956 63 y.o.  Admit date: 10/17/2017 Discharge date: 10/20/2017  Admission Diagnoses:  Closed lumbar fx with cord injury  Discharge Diagnoses:  Same Active Problems:   Closed lumbar fracture with cord injury, initial encounter [facility](HCC)   Discharged Condition: Stable  Hospital Course:  Daniel Patton is a 62 y.o. male who was admitted for the below procedure. There were no post operative complications. At time of discharge, pain was well controlled, ambulating with Pt/OT, tolerating po, voiding normal. Ready for discharge.  Treatments: Surgery T10-L3 posterior lateral instrumented fusion utilizing the globus creo modular screw set with 5 5 x 40 mm screws at T10 and T11 bilaterally 5 5 x 45 mm screws at T12 bilaterally 6 5 x 45 mm screws at L1-L2 and L3 bilaterally.  #2 posterior lateral arthrodesis utilizing vivigen, ALLOSTEM cortical fiber boats   Discharge Exam: Blood pressure 137/90, pulse 100, temperature 98.6 F (37 C), temperature source Oral, resp. rate 18, height 5\' 7"  (1.702 m), weight 83.9 kg (185 lb), SpO2 97 %. Awake, alert, oriented Speech fluent, appropriate CN grossly intact 5/5 BUE/BLE Wound c/d/i  Disposition: Home  Discharge Instructions    Call MD for:  difficulty breathing, headache or visual disturbances   Complete by:  As directed    Call MD for:  persistant dizziness or light-headedness   Complete by:  As directed    Call MD for:  redness, tenderness, or signs of infection (pain, swelling, redness, odor or green/yellow discharge around incision site)   Complete by:  As directed    Call MD for:  severe uncontrolled pain   Complete by:  As directed    Call MD for:  temperature >100.4   Complete by:  As directed    Diet general   Complete by:  As directed    Driving Restrictions   Complete by:  As directed    Do not drive until given clearance.   Increase activity  slowly   Complete by:  As directed    Lifting restrictions   Complete by:  As directed    Do not lift anything >10lbs. Avoid bending and twisting in awkward positions. Avoid bending at the back.     Allergies as of 10/20/2017      Reactions   Amoxil [amoxicillin] Diarrhea   Avelox [moxifloxacin] Diarrhea, Other (See Comments)   GI UPSET   Avelox [moxifloxacin] Nausea And Vomiting   Shellfish-derived Products Nausea And Vomiting   VIOLENT VOMITING   Sulfa Antibiotics Nausea And Vomiting      Medication List    STOP taking these medications   oxyCODONE 5 MG immediate release tablet Commonly known as:  Oxy IR/ROXICODONE     TAKE these medications   amLODipine 5 MG tablet Commonly known as:  NORVASC Take 5 mg by mouth daily.   D 2000 2000 units Tabs Generic drug:  Cholecalciferol Take 2,000 Units by mouth daily.   diphenhydramine-acetaminophen 25-500 MG Tabs tablet Commonly known as:  TYLENOL PM Take 2 tablets by mouth at bedtime.   methocarbamol 500 MG tablet Commonly known as:  ROBAXIN Take 1 tablet (500 mg total) by mouth every 6 (six) hours. What changed:    when to take this  reasons to take this   multivitamin with minerals Tabs tablet Take 1 tablet by mouth daily. Men's 50+   omeprazole 20 MG capsule Commonly known as:  PRILOSEC Take 20 mg by mouth daily.  ondansetron 4 MG tablet Commonly known as:  ZOFRAN Take 4 mg by mouth every 8 (eight) hours as needed for nausea or vomiting.   oxyCODONE-acetaminophen 7.5-325 MG tablet Commonly known as:  PERCOCET Take 1 tablet by mouth every 4 (four) hours as needed for severe pain.   Turmeric Curcumin 500 MG Caps Take 1,000 mg by mouth daily.      Follow-up Information    Donalee Citrin, MD Follow up.   Specialty:  Neurosurgery Contact information: [address] [place] [address] [phone]           Signed: Alyson Ingles 10/20/2017, 8:40 AM

## 2017-10-30 ENCOUNTER — Inpatient Hospital Stay: Payer: BC Managed Care – PPO | Admitting: Physical Medicine & Rehabilitation

## 2017-11-16 ENCOUNTER — Emergency Department (HOSPITAL_COMMUNITY): Payer: BC Managed Care – PPO

## 2017-11-16 ENCOUNTER — Encounter (HOSPITAL_COMMUNITY): Payer: Self-pay | Admitting: Emergency Medicine

## 2017-11-16 ENCOUNTER — Other Ambulatory Visit: Payer: Self-pay

## 2017-11-16 ENCOUNTER — Emergency Department (HOSPITAL_COMMUNITY)
Admission: EM | Admit: 2017-11-16 | Discharge: 2017-11-16 | Disposition: A | Discharge status: Home / Self Care | Payer: BC Managed Care – PPO | Attending: Emergency Medicine | Admitting: Emergency Medicine

## 2017-11-16 DIAGNOSIS — Z79899 Other long term (current) drug therapy: Secondary | ICD-10-CM | POA: Insufficient documentation

## 2017-11-16 DIAGNOSIS — R079 Chest pain, unspecified: Secondary | ICD-10-CM | POA: Insufficient documentation

## 2017-11-16 DIAGNOSIS — I10 Essential (primary) hypertension: Secondary | ICD-10-CM | POA: Diagnosis not present

## 2017-11-16 DIAGNOSIS — R072 Precordial pain: Secondary | ICD-10-CM

## 2017-11-16 LAB — CBC
HCT: 34.3 % — ABNORMAL LOW (ref 39.0–52.0)
Hemoglobin: 11.3 g/dL — ABNORMAL LOW (ref 13.0–17.0)
MCH: 29 pg (ref 26.0–34.0)
MCHC: 32.9 g/dL (ref 30.0–36.0)
MCV: 88.2 fL (ref 78.0–100.0)
PLATELETS: 400 10*3/uL (ref 150–400)
RBC: 3.89 MIL/uL — AB (ref 4.22–5.81)
RDW: 13.6 % (ref 11.5–15.5)
WBC: 13.7 10*3/uL — AB (ref 4.0–10.5)

## 2017-11-16 LAB — I-STAT TROPONIN, ED
TROPONIN I, POC: 0.01 ng/mL (ref 0.00–0.08)
TROPONIN I, POC: 0 ng/mL (ref 0.00–0.08)

## 2017-11-16 LAB — BASIC METABOLIC PANEL
Anion gap: 14 (ref 5–15)
BUN: 10 mg/dL (ref 6–20)
CALCIUM: 9.4 mg/dL (ref 8.9–10.3)
CO2: 22 mmol/L (ref 22–32)
CREATININE: 1.71 mg/dL — AB (ref 0.61–1.24)
Chloride: 104 mmol/L (ref 101–111)
GFR, EST AFRICAN AMERICAN: 48 mL/min — AB (ref >60)
GFR, EST NON AFRICAN AMERICAN: 41 mL/min — AB (ref >60)
Glucose, Bld: 108 mg/dL — ABNORMAL HIGH (ref 65–99)
Potassium: 3.8 mmol/L (ref 3.5–5.1)
SODIUM: 140 mmol/L (ref 135–145)

## 2017-11-16 LAB — D-DIMER, QUANTITATIVE: D-Dimer, Quant: 3.84 ug/mL-FEU — ABNORMAL HIGH (ref 0.00–0.50)

## 2017-11-16 MED ORDER — TECHNETIUM TO 99M ALBUMIN AGGREGATED
4.3200 | Freq: Once | INTRAVENOUS | Status: AC | PRN
  Administered 2017-11-16: 4.32 via INTRAVENOUS

## 2017-11-16 MED ORDER — TECHNETIUM TC 99M DIETHYLENETRIAME-PENTAACETIC ACID
32.9000 | Freq: Once | INTRAVENOUS | Status: AC | PRN
  Administered 2017-11-16: 32.9 via RESPIRATORY_TRACT

## 2017-11-16 NOTE — ED Notes (Signed)
This RN confirmed with lab they have d-dimer and are running it.

## 2017-11-16 NOTE — ED Provider Notes (Signed)
Blood pressure 112/83, pulse 86, resp. rate 18, height 5\' 7"  (1.702 m), weight 81.6 kg (180 lb), SpO2 96 %.  Assuming care from Dr. Blinda LeatherwoodPollina.  In short, Daniel Patton is a 62 y.o. male with a chief complaint of Chest Pain .  Refer to the original H&P for additional details.  The current plan of care is to follow V/Q scan and trend troponin.  10:01 AM The patient has no evidence of PE on VQ scan.  Repeat troponin is negative.  I will provide contact information for an outpatient cardiologist.  I have instructed the patient and his wife to call on Monday to set up an appointment to consider provocative testing.  They will return if chest pain returns or if they develop any other symptoms which we discussed in detail.   At this time, I do not feel there is any life-threatening condition present. I have reviewed and discussed all results (EKG, imaging, lab, urine as appropriate), exam findings with patient. I have reviewed nursing notes and appropriate previous records.  I feel the patient is safe to be discharged home without further emergent workup. Discussed usual and customary return precautions. Patient and family (if present) verbalize understanding and are comfortable with this plan.  Patient will follow-up with their primary care provider. If they do not have a primary care provider, information for follow-up has been provided to them. All questions have been answered.  Daniel BeneJoshua Long, MD    Daniel Patton, Joshua G, MD 11/16/17 1002

## 2017-11-16 NOTE — ED Triage Notes (Signed)
BIB EMS from home, pt had sudden onset of central CP, non radiation, sharp, 10/10. Pt took 324 ASA at home, currently CP free. Pt had recent surgery in Jan for spinal fusion. VSS.

## 2017-11-16 NOTE — ED Notes (Signed)
Pt remains in waiting room. Updated on wait for treatment room. 

## 2017-11-16 NOTE — ED Notes (Addendum)
Per [name], Nuclear Medicine, he arriveas at 0700 and VQ scan will be done after. Daniel LeatherwoodPollina, MD notified.

## 2017-11-16 NOTE — ED Provider Notes (Signed)
[facility] EMERGENCY DEPARTMENT Provider Note   CSN: [id number] Arrival date & time: 11/16/17  0053     History   Chief Complaint Chief Complaint  Patient presents with  . Chest Pain    HPI Daniel Patton is a 62 y.o. male.  Patient presents to the emergency department for evaluation of chest pain.  Patient had sudden onset of a severe, sharp and stabbing pain in the center of his chest while sitting in his recliner tonight.  He did not have any significant shortness of breath, but did get diaphoretic.  No nausea or vomiting.  Patient took 4 baby aspirin.  Pain has slowly improved but is not completely gone.      Past Medical History:  Diagnosis Date  . GERD (gastroesophageal reflux disease)   . Hypertension   . Sleep apnea    cpap    Patient Active Problem List   Diagnosis Date Noted  . Closed lumbar fracture with cord injury, initial encounter (HCC) 10/17/2017  . Concussion with loss of consciousness, with loc of unspecified duration, sequela (HCC) 09/11/2017  . Sinus tachycardia 09/11/2017  . Bacterial UTI 09/11/2017  . Lumbar transverse process fracture (HCC) 09/10/2017  . 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 COLONOSCOPY N/A 04/15/2017   Procedure: COLONOSCOPY;  Surgeon: Corbin Ade, MD;  Location: [facility] SUITE;  Service: Endoscopy;  Laterality: N/A;  1200  . HERNIA REPAIR    . IR IVC FILTER PLMT / S&I /IMG GUID/MOD SED  09/19/2017  . THORACIC FUSION  10/17/2017    Posterior Lateral Fusion Thoracic Ten-Eleven, Thoracic Eleven-Twelve, Thoracic Twelve-Lumbar One, Lumbar One-Two, Lumbar Two-Three (N/A Back)       Home Medications    Prior to Admission medications   Medication Sig Start Date End Date Taking? Authorizing Provider  amLODipine (NORVASC) 5 MG tablet Take 5 mg by mouth daily.   Yes [provider]  Cholecalciferol (D 2000) 2000 units TABS Take 2,000 Units by mouth  daily.   Yes [provider]  diphenhydramine-acetaminophen (TYLENOL PM) 25-500 MG TABS tablet Take 2 tablets by mouth at bedtime.   Yes [provider]  GARLIC PO Take 1 tablet by mouth daily.   Yes [provider]  methocarbamol (ROBAXIN) 500 MG tablet Take 1 tablet (500 mg total) by mouth every 6 (six) hours. 10/20/17  Yes Costella, Darci Current, PA-C  Multiple Vitamin (MULTIVITAMIN WITH MINERALS) TABS tablet Take 1 tablet by mouth daily. Men's 50+   Yes [provider]  omeprazole (PRILOSEC) 20 MG capsule Take 20 mg by mouth daily.   Yes [provider]  oxyCODONE-acetaminophen (PERCOCET) 7.5-325 MG tablet Take 1 tablet by mouth every 4 (four) hours as needed for severe pain. 10/20/17  Yes Costella, Darci Current, PA-C  Turmeric Curcumin 500 MG CAPS Take 1,000 mg by mouth daily.   Yes [provider]    Family History No family history on file.  Social History Social History   Tobacco Use  . Smoking status: Never Smoker  . Smokeless tobacco: Never Used  Substance Use Topics  . Alcohol use: Yes    Frequency: Never    Comment: occ  . Drug use: No     Allergies   Amoxil [amoxicillin]; Avelox [moxifloxacin]; Avelox [moxifloxacin]; Shellfish-derived products; and Sulfa antibiotics   Review of Systems Review of Systems  Constitutional: Positive for diaphoresis.  Cardiovascular: Positive for chest pain.  All other  systems reviewed and are negative.    Physical Exam Updated Vital Signs BP 112/83   Pulse 86   Resp 18   Ht 5\' 7"  (1.702 m)   Wt 81.6 kg (180 lb)   SpO2 96%   BMI 28.19 kg/m   Physical Exam  Constitutional: He is oriented to person, place, and time. He appears well-developed and well-nourished. No distress.  HENT:  Head: Normocephalic and atraumatic.  Right Ear: Hearing normal.  Left Ear: Hearing normal.  Nose: Nose normal.  Mouth/Throat: Oropharynx is clear and moist and mucous membranes are normal.  Eyes:  Conjunctivae and EOM are normal. Pupils are equal, round, and reactive to light.  Neck: Normal range of motion. Neck supple.  Cardiovascular: Regular rhythm, S1 normal and S2 normal. Exam reveals no gallop and no friction rub.  No murmur heard. Pulmonary/Chest: Effort normal and breath sounds normal. No respiratory distress. He exhibits tenderness.    Abdominal: Soft. Normal appearance and bowel sounds are normal. There is no hepatosplenomegaly. There is no tenderness. There is no rebound, no guarding, no tenderness at McBurney's point and negative Murphy's sign. No hernia.  Musculoskeletal: Normal range of motion.  Neurological: He is alert and oriented to person, place, and time. He has normal strength. No cranial nerve deficit or sensory deficit. Coordination normal. GCS eye subscore is 4. GCS verbal subscore is 5. GCS motor subscore is 6.  Skin: Skin is warm, dry and intact. No rash noted. No cyanosis.  Psychiatric: He has a normal mood and affect. His speech is normal and behavior is normal. Thought content normal.  Nursing note and vitals reviewed.    ED Treatments / Results  Labs (all labs ordered are listed, but only abnormal results are displayed) Labs Reviewed  BASIC METABOLIC PANEL - Abnormal; Notable for the following components:      Result Value   Glucose, Bld 108 (*)    Creatinine, Ser 1.71 (*)    GFR calc non Af Amer 41 (*)    GFR calc Af Amer 48 (*)    All other components within normal limits  CBC - Abnormal; Notable for the following components:   WBC 13.7 (*)    RBC 3.89 (*)    Hemoglobin 11.3 (*)    HCT 34.3 (*)    All other components within normal limits  D-DIMER, QUANTITATIVE (NOT AT [facility]) - Abnormal; Notable for the following components:   D-Dimer, Quant 3.84 (*)    All other components within normal limits  I-STAT TROPONIN, ED  I-STAT TROPONIN, ED    EKG  EKG Interpretation  Date/Time:  Saturday November 16 2017 01:00:05 EST Ventricular Rate:   104 PR Interval:  136 QRS Duration: 94 QT Interval:  346 QTC Calculation: 454 R Axis:   87 Text Interpretation:  Sinus tachycardia Otherwise normal ECG No significant change since last tracing Confirmed by Gilda CreasePollina, Damere Brandenburg J [phone]([id number]) on 11/16/2017 3:19:43 AM       Radiology Dg Chest 2 View  Result Date: 11/16/2017 CLINICAL DATA:  Acute onset of sharp mid chest pain. EXAM: CHEST  2 VIEW COMPARISON:  Chest radiograph performed 09/08/2017 FINDINGS: There are mildly displaced fractures of the left lateral sixth through ninth ribs eighth, similar in appearance to the prior study. The lungs are well-aerated. A small left pleural effusion is noted, with mild left basilar scarring. There is no evidence of pneumothorax. The heart is normal in size; the mediastinal contour is within normal limits. Thoracolumbar spinal fusion hardware is  noted. An IVC filter is seen. IMPRESSION: 1. Small left pleural effusion again noted. 2. Mildly displaced fractures of the left lateral sixth through ninth ribs, relatively stable in appearance. Electronically Signed   By: Roanna Raider M.D.   On: 11/16/2017 01:39    Procedures Procedures (including critical care time)  Medications Ordered in ED Medications - No data to display   Initial Impression / Assessment and Plan / ED Course  I have reviewed the triage vital signs and the nursing notes.  Pertinent labs & imaging results that were available during my care of the patient were reviewed by me and considered in my medical decision making (see chart for details).     Patient comes to the emergency department for evaluation of chest pain.  Patient had sudden onset of sharp pain at rest.  He does not have a history of cardiac disease.  He does not have any significant cardiac risk factors.  Initial EKG does not suggest cardiac etiology.  Troponin negative.  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  He also had a renal artery injury has  chronic renal insufficiency.  He cannot therefore undergo CT angios to rule out PE.  Will undergo VQ scan.  Will have a second troponin.  Signed out to oncoming ER physician to follow-up VQ scan and delta troponin.  If all negative, anticipate discharge.  Final Clinical Impressions(s) / ED Diagnoses   Final diagnoses:  Chest pain, unspecified type    ED Discharge Orders    None       Gilda Crease, MD 11/16/17 [phone]

## 2017-11-16 NOTE — Discharge Instructions (Signed)

## 2017-11-16 NOTE — ED Notes (Signed)
ED Provider at bedside. 

## 2017-12-09 ENCOUNTER — Encounter: Payer: Self-pay | Admitting: Cardiology

## 2017-12-09 ENCOUNTER — Ambulatory Visit: Payer: BC Managed Care – PPO | Admitting: Cardiology

## 2017-12-09 VITALS — BP 136/92 | HR 90 | Ht 67.0 in | Wt 187.2 lb

## 2017-12-09 DIAGNOSIS — S298XXS Other specified injuries of thorax, sequela: Secondary | ICD-10-CM | POA: Insufficient documentation

## 2017-12-09 DIAGNOSIS — R072 Precordial pain: Secondary | ICD-10-CM | POA: Diagnosis not present

## 2017-12-09 DIAGNOSIS — R000 Tachycardia, unspecified: Secondary | ICD-10-CM | POA: Diagnosis not present
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
## 2017-12-09 NOTE — Patient Instructions (Signed)
NO MEDICATION CHANGES    TEST SCHEDULE AT [address] Your physician has requested that you have an echocardiogram. Echocardiography is a painless test that uses sound waves to create images of your heart. It provides your doctor with information about the size and shape of your heart and how well your heart's chambers and valves are working. This procedure takes approximately one hour. There are no restrictions for this procedure.    IF TEST IS NORMAL NO FOLLOW UP APPOINTMENT IS NEEDED.WILL CONTACT YOU OF THE RESULTS

## 2017-12-11 ENCOUNTER — Encounter: Payer: Self-pay | Admitting: Cardiology
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
## 2017-12-11 NOTE — Assessment & Plan Note (Signed)
Probably at present, related to psychosocial issues.  After we initially started talking and I was able to recheck, the heart rate did appear to be back in a more normal range.

## 2017-12-11 NOTE — Assessment & Plan Note (Signed)
Not unexpectedly, he has had some episodes of chest discomfort following his significant trauma which involved chest wall trauma.  I am not convinced that despite him having an hour long episode of chest pain, that this is consistent with angina.  He has not had any recurrent episodes with ambulation in his he has been relatively physical to the extent of surgery will allow him to be.  He has not noted any further episodes after that prolonged episode and officially ruled out for MI. At this point perhaps a concern in addition to essentially ischemic etiology could be simply posttraumatic Dressler syndrome type situation.  Evaluate with 2D echocardiogram to assess for pericardial effusion versus possible cardiac contusion.

## 2017-12-13 ENCOUNTER — Ambulatory Visit (HOSPITAL_COMMUNITY): Payer: BC Managed Care – PPO | Attending: Cardiology

## 2017-12-13 ENCOUNTER — Other Ambulatory Visit: Payer: Self-pay

## 2017-12-13 DIAGNOSIS — I420 Dilated cardiomyopathy: Secondary | ICD-10-CM | POA: Insufficient documentation

## 2017-12-13 DIAGNOSIS — I5030 Unspecified diastolic (congestive) heart failure: Secondary | ICD-10-CM | POA: Diagnosis not present

## 2017-12-13 DIAGNOSIS — R072 Precordial pain: Secondary | ICD-10-CM

## 2017-12-13 DIAGNOSIS — S298XXS Other specified injuries of thorax, sequela: Secondary | ICD-10-CM

## 2017-12-13 DIAGNOSIS — R000 Tachycardia, unspecified: Secondary | ICD-10-CM | POA: Diagnosis not present

## 2017-12-24 ENCOUNTER — Ambulatory Visit: Payer: BC Managed Care – PPO | Attending: Neurosurgery | Admitting: Physical Therapy

## 2017-12-24 ENCOUNTER — Encounter: Payer: Self-pay | Admitting: Physical Therapy

## 2017-12-24 DIAGNOSIS — M545 Low back pain, unspecified: Secondary | ICD-10-CM

## 2017-12-24 DIAGNOSIS — R293 Abnormal posture: Secondary | ICD-10-CM | POA: Diagnosis present
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
## 2017-12-27 ENCOUNTER — Ambulatory Visit: Payer: BC Managed Care – PPO | Admitting: Physical Therapy

## 2017-12-27 ENCOUNTER — Encounter: Payer: Self-pay | Admitting: Physical Therapy

## 2017-12-27 DIAGNOSIS — R293 Abnormal posture: Secondary | ICD-10-CM

## 2017-12-27 DIAGNOSIS — M545 Low back pain, unspecified: Secondary | ICD-10-CM
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
## 2018-01-01 ENCOUNTER — Encounter: Payer: Self-pay | Admitting: Physical Therapy

## 2018-01-01 ENCOUNTER — Ambulatory Visit: Payer: BC Managed Care – PPO | Admitting: Physical Therapy

## 2018-01-01 DIAGNOSIS — R293 Abnormal posture: Secondary | ICD-10-CM

## 2018-01-01 DIAGNOSIS — M545 Low back pain, unspecified: Secondary | ICD-10-CM

## 2018-01-01 NOTE — Patient Instructions (Signed)
Pelvic Tilt: Posterior - Legs Bent (Supine)   Tighten stomach and flatten back by rolling pelvis down. Hold _10___ seconds. Relax. Repeat _10-30___ times per set. Do __2__ sets per session. Do _2___ sessions per day.     Tighten stomach and slowly raise right leg _5___ inches from floor. Keep trunk rigid. Hold _3___ seconds. Repeat _10___ times per set. Do ___2-3_ sets per session. Do __2__ sessions per day.    Stretch Break - Chest and Shoulder Stretch   Maintaining erect posture, draw shoulders back while bringing elbows back and inward. Return to starting position. Repeat __10-20__ times every _3-4___ hours.
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
## 2018-01-03 ENCOUNTER — Ambulatory Visit: Payer: BC Managed Care – PPO | Admitting: *Deleted

## 2018-01-03 DIAGNOSIS — M545 Low back pain, unspecified: Secondary | ICD-10-CM

## 2018-01-03 DIAGNOSIS — R293 Abnormal posture: Secondary | ICD-10-CM
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
## 2018-01-08 ENCOUNTER — Ambulatory Visit: Payer: BC Managed Care – PPO | Attending: Neurosurgery | Admitting: Physical Therapy

## 2018-01-08 ENCOUNTER — Encounter: Payer: Self-pay | Admitting: Physical Therapy

## 2018-01-08 DIAGNOSIS — R293 Abnormal posture: Secondary | ICD-10-CM | POA: Insufficient documentation

## 2018-01-08 DIAGNOSIS — M545 Low back pain, unspecified: Secondary | ICD-10-CM
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
## 2018-01-10 ENCOUNTER — Encounter: Payer: Self-pay | Admitting: Physical Therapy

## 2018-01-10 ENCOUNTER — Ambulatory Visit: Payer: BC Managed Care – PPO | Admitting: Physical Therapy

## 2018-01-10 DIAGNOSIS — R293 Abnormal posture: Secondary | ICD-10-CM

## 2018-01-10 DIAGNOSIS — M545 Low back pain, unspecified: Secondary | ICD-10-CM
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
## 2018-01-15 ENCOUNTER — Encounter: Payer: BC Managed Care – PPO | Admitting: Physical Therapy

## 2018-01-17 ENCOUNTER — Ambulatory Visit: Payer: BC Managed Care – PPO | Admitting: Physical Therapy

## 2018-01-17 ENCOUNTER — Encounter: Payer: Self-pay | Admitting: Physical Therapy

## 2018-01-17 DIAGNOSIS — M545 Low back pain, unspecified: Secondary | ICD-10-CM

## 2018-01-17 DIAGNOSIS — R293 Abnormal posture: Secondary | ICD-10-CM | POA: Diagnosis not present
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
## 2018-01-22 ENCOUNTER — Ambulatory Visit: Payer: BC Managed Care – PPO | Admitting: Physical Therapy

## 2018-01-22 DIAGNOSIS — M545 Low back pain, unspecified: Secondary | ICD-10-CM

## 2018-01-22 DIAGNOSIS — R293 Abnormal posture: Secondary | ICD-10-CM | POA: Diagnosis not present
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
## 2018-01-23 ENCOUNTER — Encounter: Payer: Self-pay | Admitting: Physical Therapy

## 2018-01-23 ENCOUNTER — Ambulatory Visit: Payer: BC Managed Care – PPO | Admitting: Physical Therapy

## 2018-01-23 DIAGNOSIS — R293 Abnormal posture: Secondary | ICD-10-CM | POA: Diagnosis not present

## 2018-01-23 DIAGNOSIS — M545 Low back pain, unspecified: Secondary | ICD-10-CM
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
## 2018-01-29 ENCOUNTER — Encounter: Payer: Self-pay | Admitting: Physical Therapy

## 2018-01-29 ENCOUNTER — Ambulatory Visit: Payer: BC Managed Care – PPO | Admitting: Physical Therapy

## 2018-01-29 DIAGNOSIS — R293 Abnormal posture: Secondary | ICD-10-CM

## 2018-01-29 DIAGNOSIS — M545 Low back pain, unspecified: Secondary | ICD-10-CM
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
## 2018-01-31 ENCOUNTER — Ambulatory Visit: Payer: BC Managed Care – PPO | Admitting: Physical Therapy

## 2018-01-31 DIAGNOSIS — R293 Abnormal posture: Secondary | ICD-10-CM

## 2018-01-31 DIAGNOSIS — M545 Low back pain, unspecified: Secondary | ICD-10-CM
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
## 2018-02-05 ENCOUNTER — Ambulatory Visit: Payer: BC Managed Care – PPO | Attending: Neurosurgery | Admitting: Physical Therapy

## 2018-02-05 DIAGNOSIS — M25512 Pain in left shoulder: Secondary | ICD-10-CM | POA: Diagnosis present

## 2018-02-05 DIAGNOSIS — M545 Low back pain, unspecified: Secondary | ICD-10-CM

## 2018-02-05 DIAGNOSIS — R293 Abnormal posture: Secondary | ICD-10-CM | POA: Insufficient documentation

## 2018-02-05 DIAGNOSIS — M546 Pain in thoracic spine: Secondary | ICD-10-CM | POA: Diagnosis present
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
## 2018-02-07 ENCOUNTER — Ambulatory Visit: Payer: BC Managed Care – PPO | Admitting: Physical Therapy

## 2018-02-07 DIAGNOSIS — M545 Low back pain, unspecified: Secondary | ICD-10-CM

## 2018-02-07 DIAGNOSIS — R293 Abnormal posture: Secondary | ICD-10-CM
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
## 2018-02-12 ENCOUNTER — Ambulatory Visit: Payer: BC Managed Care – PPO | Admitting: Physical Therapy

## 2018-02-12 DIAGNOSIS — M545 Low back pain, unspecified: Secondary | ICD-10-CM

## 2018-02-12 DIAGNOSIS — R293 Abnormal posture: Secondary | ICD-10-CM | POA: Diagnosis not present
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
## 2018-02-14 ENCOUNTER — Ambulatory Visit: Payer: BC Managed Care – PPO | Admitting: Physical Therapy

## 2018-02-14 DIAGNOSIS — R293 Abnormal posture: Secondary | ICD-10-CM | POA: Diagnosis not present

## 2018-02-14 DIAGNOSIS — M545 Low back pain, unspecified: Secondary | ICD-10-CM
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
## 2018-02-19 ENCOUNTER — Encounter: Payer: Self-pay | Admitting: Physical Therapy

## 2018-02-19 ENCOUNTER — Ambulatory Visit: Payer: BC Managed Care – PPO | Admitting: Physical Therapy

## 2018-02-19 DIAGNOSIS — M545 Low back pain, unspecified: Secondary | ICD-10-CM

## 2018-02-19 DIAGNOSIS — R293 Abnormal posture: Secondary | ICD-10-CM | POA: Diagnosis not present
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
## 2018-02-20 ENCOUNTER — Encounter: Payer: BC Managed Care – PPO | Admitting: Physical Therapy

## 2018-02-26 ENCOUNTER — Ambulatory Visit: Payer: BC Managed Care – PPO | Admitting: Physical Therapy

## 2018-02-26 ENCOUNTER — Encounter: Payer: Self-pay | Admitting: Physical Therapy

## 2018-02-26 DIAGNOSIS — R293 Abnormal posture: Secondary | ICD-10-CM | POA: Diagnosis not present

## 2018-02-26 DIAGNOSIS — M545 Low back pain, unspecified: Secondary | ICD-10-CM
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
## 2018-02-28 ENCOUNTER — Ambulatory Visit: Payer: BC Managed Care – PPO | Admitting: Physical Therapy

## 2018-02-28 DIAGNOSIS — M545 Low back pain, unspecified: Secondary | ICD-10-CM

## 2018-02-28 DIAGNOSIS — R293 Abnormal posture: Secondary | ICD-10-CM | POA: Diagnosis not present
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
## 2018-03-05 ENCOUNTER — Encounter: Payer: Self-pay | Admitting: Physical Therapy

## 2018-03-05 ENCOUNTER — Other Ambulatory Visit: Payer: Self-pay | Admitting: General Surgery

## 2018-03-05 ENCOUNTER — Ambulatory Visit: Payer: BC Managed Care – PPO | Admitting: Physical Therapy

## 2018-03-05 DIAGNOSIS — K439 Ventral hernia without obstruction or gangrene: Secondary | ICD-10-CM

## 2018-03-05 DIAGNOSIS — R293 Abnormal posture: Secondary | ICD-10-CM | POA: Diagnosis not present

## 2018-03-05 DIAGNOSIS — M545 Low back pain, unspecified: Secondary | ICD-10-CM
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
## 2018-03-07 ENCOUNTER — Encounter: Payer: Self-pay | Admitting: Physical Therapy

## 2018-03-07 ENCOUNTER — Ambulatory Visit: Payer: BC Managed Care – PPO | Admitting: Physical Therapy

## 2018-03-07 DIAGNOSIS — R293 Abnormal posture: Secondary | ICD-10-CM

## 2018-03-07 DIAGNOSIS — M545 Low back pain, unspecified: Secondary | ICD-10-CM

## 2018-03-07 DIAGNOSIS — M546 Pain in thoracic spine: Secondary | ICD-10-CM

## 2018-03-07 DIAGNOSIS — M25512 Pain in left shoulder: Secondary | ICD-10-CM
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
## 2018-03-12 ENCOUNTER — Ambulatory Visit: Payer: BC Managed Care – PPO | Attending: Neurosurgery | Admitting: Physical Therapy

## 2018-03-12 DIAGNOSIS — M25512 Pain in left shoulder: Secondary | ICD-10-CM | POA: Insufficient documentation

## 2018-03-12 DIAGNOSIS — M546 Pain in thoracic spine: Secondary | ICD-10-CM | POA: Diagnosis present

## 2018-03-12 DIAGNOSIS — M545 Low back pain, unspecified: Secondary | ICD-10-CM

## 2018-03-12 DIAGNOSIS — R293 Abnormal posture: Secondary | ICD-10-CM | POA: Insufficient documentation
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
## 2018-03-14 ENCOUNTER — Ambulatory Visit: Payer: BC Managed Care – PPO | Admitting: Physical Therapy

## 2018-03-14 ENCOUNTER — Encounter: Payer: Self-pay | Admitting: Physical Therapy

## 2018-03-14 DIAGNOSIS — M546 Pain in thoracic spine: Secondary | ICD-10-CM

## 2018-03-14 DIAGNOSIS — M545 Low back pain, unspecified: Secondary | ICD-10-CM

## 2018-03-14 DIAGNOSIS — R293 Abnormal posture: Secondary | ICD-10-CM

## 2018-03-14 DIAGNOSIS — M25512 Pain in left shoulder: Secondary | ICD-10-CM
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
## 2018-03-17 ENCOUNTER — Other Ambulatory Visit: Payer: Self-pay | Admitting: General Surgery

## 2018-03-17 ENCOUNTER — Ambulatory Visit
Admission: RE | Admit: 2018-03-17 | Discharge: 2018-03-17 | Disposition: A | Discharge status: Home / Self Care | Payer: BC Managed Care – PPO | Source: Ambulatory Visit | Attending: General Surgery | Admitting: General Surgery

## 2018-03-17 DIAGNOSIS — K439 Ventral hernia without obstruction or gangrene: Secondary | ICD-10-CM

## 2018-03-17 MED ORDER — IOPAMIDOL (ISOVUE-300) INJECTION 61%: 100.0000 mL | Freq: Once | INTRAVENOUS | Status: DC | PRN

## 2018-03-19 ENCOUNTER — Encounter: Payer: Self-pay | Admitting: Physical Therapy

## 2018-03-19 ENCOUNTER — Ambulatory Visit: Payer: BC Managed Care – PPO | Admitting: Physical Therapy

## 2018-03-19 DIAGNOSIS — M545 Low back pain, unspecified: Secondary | ICD-10-CM

## 2018-03-19 DIAGNOSIS — R293 Abnormal posture: Secondary | ICD-10-CM | POA: Diagnosis not present

## 2018-03-19 DIAGNOSIS — M25512 Pain in left shoulder: Secondary | ICD-10-CM

## 2018-03-19 DIAGNOSIS — M546 Pain in thoracic spine: Secondary | ICD-10-CM
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
## 2018-03-26 ENCOUNTER — Encounter: Payer: BC Managed Care – PPO | Admitting: Physical Therapy

## 2018-03-28 ENCOUNTER — Ambulatory Visit: Payer: BC Managed Care – PPO | Admitting: Physical Therapy

## 2018-03-28 DIAGNOSIS — R293 Abnormal posture: Secondary | ICD-10-CM

## 2018-03-28 DIAGNOSIS — M546 Pain in thoracic spine: Secondary | ICD-10-CM

## 2018-03-28 DIAGNOSIS — M25512 Pain in left shoulder: Secondary | ICD-10-CM

## 2018-03-28 DIAGNOSIS — M545 Low back pain, unspecified: Secondary | ICD-10-CM
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
## 2018-04-15 ENCOUNTER — Ambulatory Visit: Payer: BC Managed Care – PPO | Attending: Neurosurgery | Admitting: Physical Therapy

## 2018-04-15 DIAGNOSIS — M546 Pain in thoracic spine: Secondary | ICD-10-CM | POA: Diagnosis present

## 2018-04-15 DIAGNOSIS — R293 Abnormal posture: Secondary | ICD-10-CM | POA: Insufficient documentation

## 2018-04-15 DIAGNOSIS — M25512 Pain in left shoulder: Secondary | ICD-10-CM | POA: Insufficient documentation

## 2018-04-15 DIAGNOSIS — M545 Low back pain, unspecified: Secondary | ICD-10-CM
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
## 2018-04-22 ENCOUNTER — Ambulatory Visit: Payer: BC Managed Care – PPO | Admitting: Physical Therapy

## 2018-04-22 DIAGNOSIS — M546 Pain in thoracic spine: Secondary | ICD-10-CM

## 2018-04-22 DIAGNOSIS — M545 Low back pain, unspecified: Secondary | ICD-10-CM

## 2018-04-22 DIAGNOSIS — R293 Abnormal posture: Secondary | ICD-10-CM | POA: Diagnosis not present

## 2018-04-22 DIAGNOSIS — M25512 Pain in left shoulder: Secondary | ICD-10-CM
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
## 2018-05-02 ENCOUNTER — Encounter: Payer: Self-pay | Admitting: Physical Therapy

## 2018-05-02 ENCOUNTER — Ambulatory Visit: Payer: BC Managed Care – PPO | Admitting: Physical Therapy

## 2018-05-02 DIAGNOSIS — M545 Low back pain, unspecified: Secondary | ICD-10-CM

## 2018-05-02 DIAGNOSIS — R293 Abnormal posture: Secondary | ICD-10-CM
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
## 2018-05-06 ENCOUNTER — Ambulatory Visit: Payer: BC Managed Care – PPO | Admitting: Physical Therapy

## 2018-05-06 DIAGNOSIS — R293 Abnormal posture: Secondary | ICD-10-CM | POA: Diagnosis not present

## 2018-05-06 DIAGNOSIS — M545 Low back pain, unspecified: Secondary | ICD-10-CM

## 2018-05-06 DIAGNOSIS — M25512 Pain in left shoulder: Secondary | ICD-10-CM

## 2018-05-07 ENCOUNTER — Ambulatory Visit: Payer: BC Managed Care – PPO | Admitting: Physical Therapy
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
## 2018-07-07 ENCOUNTER — Other Ambulatory Visit: Payer: Self-pay | Admitting: *Deleted

## 2018-07-07 ENCOUNTER — Encounter: Payer: Self-pay | Admitting: Vascular Surgery

## 2018-07-07 ENCOUNTER — Ambulatory Visit (INDEPENDENT_AMBULATORY_CARE_PROVIDER_SITE_OTHER): Payer: BC Managed Care – PPO | Admitting: Vascular Surgery

## 2018-07-07 VITALS — BP 140/93 | HR 72 | Temp 97.8°F | Resp 20 | Ht 67.0 in | Wt 204.0 lb

## 2018-07-07 DIAGNOSIS — Z95828 Presence of other vascular implants and grafts: Secondary | ICD-10-CM | POA: Diagnosis not present
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
## 2018-07-07 NOTE — Progress Notes (Signed)
Patient instructed to be at [facility] admitting at 5:30 am on 07/14/18 for procedure. NPO past MN night prior must have a driver and caregiver for discharge. Take Amlodipine with sips of water am of. Hold all others until after procedure. Verbalized understanding and will call if questions.

## 2018-07-14 ENCOUNTER — Encounter (HOSPITAL_COMMUNITY): Payer: Self-pay | Admitting: Vascular Surgery

## 2018-07-14 ENCOUNTER — Other Ambulatory Visit: Payer: Self-pay

## 2018-07-14 ENCOUNTER — Encounter (HOSPITAL_COMMUNITY)
Admission: RE | Disposition: A | Discharge status: Home / Self Care | Payer: Self-pay | Source: Ambulatory Visit | Attending: Vascular Surgery

## 2018-07-14 ENCOUNTER — Ambulatory Visit (HOSPITAL_COMMUNITY)
Admission: RE | Admit: 2018-07-14 | Discharge: 2018-07-14 | Disposition: A | Discharge status: Home / Self Care | Payer: BC Managed Care – PPO | Source: Ambulatory Visit | Attending: Vascular Surgery | Admitting: Vascular Surgery

## 2018-07-14 DIAGNOSIS — Z981 Arthrodesis status: Secondary | ICD-10-CM | POA: Diagnosis not present

## 2018-07-14 DIAGNOSIS — I10 Essential (primary) hypertension: Secondary | ICD-10-CM | POA: Insufficient documentation

## 2018-07-14 DIAGNOSIS — Z87828 Personal history of other (healed) physical injury and trauma: Secondary | ICD-10-CM | POA: Insufficient documentation

## 2018-07-14 DIAGNOSIS — Z91013 Allergy to seafood: Secondary | ICD-10-CM | POA: Insufficient documentation

## 2018-07-14 DIAGNOSIS — K219 Gastro-esophageal reflux disease without esophagitis: Secondary | ICD-10-CM | POA: Insufficient documentation

## 2018-07-14 DIAGNOSIS — Z452 Encounter for adjustment and management of vascular access device: Secondary | ICD-10-CM | POA: Insufficient documentation

## 2018-07-14 DIAGNOSIS — Z79899 Other long term (current) drug therapy: Secondary | ICD-10-CM | POA: Diagnosis not present

## 2018-07-14 DIAGNOSIS — G4730 Sleep apnea, unspecified: Secondary | ICD-10-CM | POA: Diagnosis not present

## 2018-07-14 DIAGNOSIS — Z823 Family history of stroke: Secondary | ICD-10-CM | POA: Diagnosis not present

## 2018-07-14 DIAGNOSIS — Z880 Allergy status to penicillin: Secondary | ICD-10-CM | POA: Insufficient documentation

## 2018-07-14 DIAGNOSIS — Z882 Allergy status to sulfonamides status: Secondary | ICD-10-CM | POA: Diagnosis not present

## 2018-07-14 DIAGNOSIS — Z8249 Family history of ischemic heart disease and other diseases of the circulatory system: Secondary | ICD-10-CM | POA: Insufficient documentation

## 2018-07-14 DIAGNOSIS — Z881 Allergy status to other antibiotic agents status: Secondary | ICD-10-CM | POA: Insufficient documentation

## 2018-07-14 DIAGNOSIS — Z87891 Personal history of nicotine dependence: Secondary | ICD-10-CM | POA: Insufficient documentation

## 2018-07-14 DIAGNOSIS — I82592 Chronic embolism and thrombosis of other specified deep vein of left lower extremity: Secondary | ICD-10-CM | POA: Diagnosis not present

## 2018-07-14 HISTORY — PX: IVC FILTER REMOVAL: CATH118246

## 2018-07-14 LAB — POCT I-STAT, CHEM 8
BUN: 23 mg/dL (ref 8–23)
CALCIUM ION: 1.15 mmol/L (ref 1.15–1.40)
CREATININE: 1.8 mg/dL — AB (ref 0.61–1.24)
Chloride: 106 mmol/L (ref 98–111)
Glucose, Bld: 101 mg/dL — ABNORMAL HIGH (ref 70–99)
HCT: 32 % — ABNORMAL LOW (ref 39.0–52.0)
HEMOGLOBIN: 10.9 g/dL — AB (ref 13.0–17.0)
Potassium: 3.8 mmol/L (ref 3.5–5.1)
Sodium: 142 mmol/L (ref 135–145)
TCO2: 23 mmol/L (ref 22–32)

## 2018-07-14 SURGERY — IVC FILTER REMOVAL
Anesthesia: LOCAL

## 2018-07-14 MED ORDER — HEPARIN (PORCINE) IN NACL 1000-0.9 UT/500ML-% IV SOLN
INTRAVENOUS | Status: AC
  Filled 2018-07-14: qty 500

## 2018-07-14 MED ORDER — LIDOCAINE HCL (PF) 1 % IJ SOLN
INTRAMUSCULAR | Status: AC
  Filled 2018-07-14: qty 30

## 2018-07-14 MED ORDER — FENTANYL CITRATE (PF) 100 MCG/2ML IJ SOLN
INTRAMUSCULAR | Status: AC
  Filled 2018-07-14: qty 2

## 2018-07-14 MED ORDER — SODIUM CHLORIDE 0.9 % IV SOLN
INTRAVENOUS | Status: DC
  Administered 2018-07-14: 07:00:00 via INTRAVENOUS

## 2018-07-14 MED ORDER — IODIXANOL 320 MG/ML IV SOLN
INTRAVENOUS | Status: DC | PRN
  Administered 2018-07-14: 16 mL via INTRAVENOUS

## 2018-07-14 MED ORDER — FENTANYL CITRATE (PF) 100 MCG/2ML IJ SOLN
INTRAMUSCULAR | Status: DC | PRN
  Administered 2018-07-14: 25 ug via INTRAVENOUS

## 2018-07-14 MED ORDER — HEPARIN (PORCINE) IN NACL 1000-0.9 UT/500ML-% IV SOLN
INTRAVENOUS | Status: DC | PRN
  Administered 2018-07-14: 500 mL

## 2018-07-14 MED ORDER — MIDAZOLAM HCL 2 MG/2ML IJ SOLN
INTRAMUSCULAR | Status: AC
  Filled 2018-07-14: qty 2

## 2018-07-14 MED ORDER — MIDAZOLAM HCL 2 MG/2ML IJ SOLN
INTRAMUSCULAR | Status: DC | PRN
  Administered 2018-07-14: 1 mg via INTRAVENOUS

## 2018-07-14 MED ORDER — LIDOCAINE HCL (PF) 1 % IJ SOLN
INTRAMUSCULAR | Status: DC | PRN
  Administered 2018-07-14: 10 mL

## 2018-07-14 SURGICAL SUPPLY — 7 items
BAG SNAP BAND KOVER 36X36 (MISCELLANEOUS) ×2 IMPLANT
KIT MICROPUNCTURE NIT STIFF (SHEATH) ×2 IMPLANT
PROTECTION STATION PRESSURIZED (MISCELLANEOUS) ×2
SET VENACAVA FILTER RETRIEVAL (MISCELLANEOUS) ×2 IMPLANT
SHEATH PROBE COVER 6X72 (BAG) ×2 IMPLANT
STATION PROTECTION PRESSURIZED (MISCELLANEOUS) ×1 IMPLANT
TRAY PV CATH (CUSTOM PROCEDURE TRAY) ×2 IMPLANT

## 2018-07-14 NOTE — Discharge Instructions (Signed)
Inferior Vena Cava Filter Removal, Care After This sheet gives you information about how to care for yourself after your procedure. Your health care provider may also give you more specific instructions. If you have problems or questions, contact your health care provider. What can I expect after the procedure? After your procedure, it is common to have:  Mild pain in the area where the filter was inserted.  Mild bruising in the area where the filter was inserted.  Follow these instructions at home: Insertion site care  Follow instructions from your health care provider about how to take care of the site where a catheter was removed at your neck. Make sure you: ? Wash your hands with soap and water before you change your bandage (dressing). If soap and water are not available, use hand sanitizer. ? Change your dressing as told by your health care provider.  Check your insertion site every day for signs of infection. Check for: ? More redness, swelling, or pain. ? More fluid or blood. ? Warmth. ? Pus or a bad smell.  Keep the insertion site clean and dry.  Do not shower, bathe, use a hot tub, or let the dressing get wet until your health care provider approves. General instructions  Take over-the-counter and prescription medicines only as told by your health care provider.  Avoid heavy lifting or hard activities for 48 hours after the procedure or as told by your health care provider.  Do not drive for 24 hours if you were given a a medicine to help you relax (sedative).  Do not drive or use heavy machinery while taking prescription pain medicine.  Do not go back to school or work until your health care provider approves.  Keep all follow-up visits as told by your health care provider. This is important. Contact a health care provider if:  You have more redness, swelling, or pain around your insertion site.  You have more fluid or blood coming from your insertion  site.  Your insertion site feels warm to the touch.  You have pus or a bad smell coming from your insertion site.  You have a fever.  You are dizzy.  You have nausea and vomiting.  You develop a rash. Get help right away if:  You develop chest pain, a cough, or difficulty breathing.  You develop shortness of breath, feel faint, or pass out.  You cough up blood.  You have severe pain in your abdomen.  You develop swelling and discoloration or pain in your legs.  Your legs become pale and cold or blue.  You develop weakness, difficulty moving your arms or legs, or balance problems.  You develop problems with speech or vision. These symptoms may represent a serious problem that is an emergency. Do not wait to see if the symptoms will go away. Get medical help right away. Call your local emergency services (911 in the [place] Do not drive yourself to the hospital. Summary  After your removal procedure, it is common to have mild pain and bruising.  Do not shower, bathe, use a hot tub, or let the dressing get wet until your health care provider approves.  Every day, check for signs of infection where a catheter was removed at your neck or groin This information is not intended to replace advice given to you by your health care provider. Make sure you discuss any questions you have with your health care provider. Document Released: 07/15/2013 Document Revised: 08/15/2016 Document Reviewed: 08/15/2016 Elsevier  Interactive Patient Education  2017 [employer].

## 2018-07-14 NOTE — H&P (Signed)
   History and Physical Update  The patient was interviewed and re-examined.  The patient's previous History and Physical has been reviewed and is unchanged from recent office visit. Plan for ivc filter removal in pv lab today.  Everley Evora C. Randie Heinz, MD [facility]ists of [place] Office: [phone] Pager: [phone]  07/14/2018, 7:52 AM
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
## 2018-08-04 ENCOUNTER — Encounter: Payer: BC Managed Care – PPO | Admitting: Surgery
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
## 2020-05-19 DIAGNOSIS — G4733 Obstructive sleep apnea (adult) (pediatric): Secondary | ICD-10-CM | POA: Diagnosis not present

## 2020-05-30 DIAGNOSIS — M9903 Segmental and somatic dysfunction of lumbar region: Secondary | ICD-10-CM | POA: Diagnosis not present

## 2020-05-30 DIAGNOSIS — M62830 Muscle spasm of back: Secondary | ICD-10-CM | POA: Diagnosis not present

## 2020-05-30 DIAGNOSIS — M9901 Segmental and somatic dysfunction of cervical region: Secondary | ICD-10-CM | POA: Diagnosis not present

## 2020-05-30 DIAGNOSIS — M9902 Segmental and somatic dysfunction of thoracic region: Secondary | ICD-10-CM | POA: Diagnosis not present

## 2020-05-31 DIAGNOSIS — M9902 Segmental and somatic dysfunction of thoracic region: Secondary | ICD-10-CM | POA: Diagnosis not present

## 2020-05-31 DIAGNOSIS — M9903 Segmental and somatic dysfunction of lumbar region: Secondary | ICD-10-CM | POA: Diagnosis not present

## 2020-05-31 DIAGNOSIS — M9901 Segmental and somatic dysfunction of cervical region: Secondary | ICD-10-CM | POA: Diagnosis not present

## 2020-05-31 DIAGNOSIS — M62830 Muscle spasm of back: Secondary | ICD-10-CM | POA: Diagnosis not present

## 2020-06-02 DIAGNOSIS — M9902 Segmental and somatic dysfunction of thoracic region: Secondary | ICD-10-CM | POA: Diagnosis not present

## 2020-06-02 DIAGNOSIS — M62830 Muscle spasm of back: Secondary | ICD-10-CM | POA: Diagnosis not present

## 2020-06-02 DIAGNOSIS — M9901 Segmental and somatic dysfunction of cervical region: Secondary | ICD-10-CM | POA: Diagnosis not present

## 2020-06-02 DIAGNOSIS — M9903 Segmental and somatic dysfunction of lumbar region: Secondary | ICD-10-CM | POA: Diagnosis not present

## 2020-06-07 DIAGNOSIS — M62830 Muscle spasm of back: Secondary | ICD-10-CM | POA: Diagnosis not present

## 2020-06-07 DIAGNOSIS — M9903 Segmental and somatic dysfunction of lumbar region: Secondary | ICD-10-CM | POA: Diagnosis not present

## 2020-06-07 DIAGNOSIS — M9902 Segmental and somatic dysfunction of thoracic region: Secondary | ICD-10-CM | POA: Diagnosis not present

## 2020-06-07 DIAGNOSIS — M9901 Segmental and somatic dysfunction of cervical region: Secondary | ICD-10-CM | POA: Diagnosis not present

## 2020-06-14 DIAGNOSIS — D2261 Melanocytic nevi of right upper limb, including shoulder: Secondary | ICD-10-CM | POA: Diagnosis not present

## 2020-06-14 DIAGNOSIS — L570 Actinic keratosis: Secondary | ICD-10-CM | POA: Diagnosis not present

## 2020-06-14 DIAGNOSIS — L814 Other melanin hyperpigmentation: Secondary | ICD-10-CM | POA: Diagnosis not present

## 2020-06-14 DIAGNOSIS — L578 Other skin changes due to chronic exposure to nonionizing radiation: Secondary | ICD-10-CM | POA: Diagnosis not present

## 2020-06-14 DIAGNOSIS — D225 Melanocytic nevi of trunk: Secondary | ICD-10-CM | POA: Diagnosis not present

## 2020-06-14 DIAGNOSIS — D485 Neoplasm of uncertain behavior of skin: Secondary | ICD-10-CM | POA: Diagnosis not present

## 2020-06-14 DIAGNOSIS — L821 Other seborrheic keratosis: Secondary | ICD-10-CM | POA: Diagnosis not present

## 2020-06-16 DIAGNOSIS — M9902 Segmental and somatic dysfunction of thoracic region: Secondary | ICD-10-CM | POA: Diagnosis not present

## 2020-06-16 DIAGNOSIS — M62830 Muscle spasm of back: Secondary | ICD-10-CM | POA: Diagnosis not present

## 2020-06-16 DIAGNOSIS — M9901 Segmental and somatic dysfunction of cervical region: Secondary | ICD-10-CM | POA: Diagnosis not present

## 2020-06-16 DIAGNOSIS — M9903 Segmental and somatic dysfunction of lumbar region: Secondary | ICD-10-CM | POA: Diagnosis not present

## 2020-06-17 DIAGNOSIS — U071 COVID-19: Secondary | ICD-10-CM | POA: Diagnosis not present

## 2020-07-04 DIAGNOSIS — N179 Acute kidney failure, unspecified: Secondary | ICD-10-CM | POA: Diagnosis not present

## 2020-07-04 DIAGNOSIS — Z23 Encounter for immunization: Secondary | ICD-10-CM | POA: Diagnosis not present

## 2020-07-04 DIAGNOSIS — U071 COVID-19: Secondary | ICD-10-CM | POA: Diagnosis not present

## 2020-07-04 DIAGNOSIS — N183 Chronic kidney disease, stage 3 unspecified: Secondary | ICD-10-CM | POA: Diagnosis not present

## 2020-07-04 DIAGNOSIS — D649 Anemia, unspecified: Secondary | ICD-10-CM | POA: Diagnosis not present

## 2020-07-04 DIAGNOSIS — I10 Essential (primary) hypertension: Secondary | ICD-10-CM | POA: Diagnosis not present

## 2020-07-04 DIAGNOSIS — R739 Hyperglycemia, unspecified: Secondary | ICD-10-CM | POA: Diagnosis not present

## 2020-07-04 DIAGNOSIS — K219 Gastro-esophageal reflux disease without esophagitis: Secondary | ICD-10-CM | POA: Diagnosis not present

## 2020-07-04 DIAGNOSIS — E7800 Pure hypercholesterolemia, unspecified: Secondary | ICD-10-CM | POA: Diagnosis not present

## 2020-07-08 DIAGNOSIS — G4733 Obstructive sleep apnea (adult) (pediatric): Secondary | ICD-10-CM | POA: Diagnosis not present

## 2020-07-12 DIAGNOSIS — N183 Chronic kidney disease, stage 3 unspecified: Secondary | ICD-10-CM | POA: Diagnosis not present

## 2020-07-18 DIAGNOSIS — N1832 Chronic kidney disease, stage 3b: Secondary | ICD-10-CM | POA: Diagnosis not present

## 2020-07-18 DIAGNOSIS — N2581 Secondary hyperparathyroidism of renal origin: Secondary | ICD-10-CM | POA: Diagnosis not present

## 2020-07-18 DIAGNOSIS — D631 Anemia in chronic kidney disease: Secondary | ICD-10-CM | POA: Diagnosis not present

## 2020-07-18 DIAGNOSIS — I129 Hypertensive chronic kidney disease with stage 1 through stage 4 chronic kidney disease, or unspecified chronic kidney disease: Secondary | ICD-10-CM | POA: Diagnosis not present

## 2020-09-19 DIAGNOSIS — I10 Essential (primary) hypertension: Secondary | ICD-10-CM | POA: Diagnosis not present

## 2020-09-19 DIAGNOSIS — E7800 Pure hypercholesterolemia, unspecified: Secondary | ICD-10-CM | POA: Diagnosis not present

## 2020-09-19 DIAGNOSIS — D649 Anemia, unspecified: Secondary | ICD-10-CM | POA: Diagnosis not present

## 2020-09-19 DIAGNOSIS — N179 Acute kidney failure, unspecified: Secondary | ICD-10-CM | POA: Diagnosis not present

## 2020-09-19 DIAGNOSIS — N183 Chronic kidney disease, stage 3 unspecified: Secondary | ICD-10-CM | POA: Diagnosis not present

## 2020-09-19 DIAGNOSIS — Z0000 Encounter for general adult medical examination without abnormal findings: Secondary | ICD-10-CM | POA: Diagnosis not present

## 2020-09-19 DIAGNOSIS — R739 Hyperglycemia, unspecified: Secondary | ICD-10-CM | POA: Diagnosis not present

## 2020-09-19 DIAGNOSIS — K219 Gastro-esophageal reflux disease without esophagitis: Secondary | ICD-10-CM | POA: Diagnosis not present

## 2020-09-19 DIAGNOSIS — Z23 Encounter for immunization: Secondary | ICD-10-CM | POA: Diagnosis not present

## 2020-09-19 DIAGNOSIS — U071 COVID-19: Secondary | ICD-10-CM | POA: Diagnosis not present

## 2020-10-03 DIAGNOSIS — N179 Acute kidney failure, unspecified: Secondary | ICD-10-CM | POA: Diagnosis not present

## 2020-10-03 DIAGNOSIS — K219 Gastro-esophageal reflux disease without esophagitis: Secondary | ICD-10-CM | POA: Diagnosis not present

## 2020-10-03 DIAGNOSIS — U071 COVID-19: Secondary | ICD-10-CM | POA: Diagnosis not present

## 2020-10-03 DIAGNOSIS — E7800 Pure hypercholesterolemia, unspecified: Secondary | ICD-10-CM | POA: Diagnosis not present

## 2020-10-03 DIAGNOSIS — D649 Anemia, unspecified: Secondary | ICD-10-CM | POA: Diagnosis not present

## 2020-10-03 DIAGNOSIS — I10 Essential (primary) hypertension: Secondary | ICD-10-CM | POA: Diagnosis not present

## 2020-10-03 DIAGNOSIS — N183 Chronic kidney disease, stage 3 unspecified: Secondary | ICD-10-CM | POA: Diagnosis not present

## 2020-10-03 DIAGNOSIS — Z0000 Encounter for general adult medical examination without abnormal findings: Secondary | ICD-10-CM | POA: Diagnosis not present

## 2020-10-03 DIAGNOSIS — R739 Hyperglycemia, unspecified: Secondary | ICD-10-CM | POA: Diagnosis not present

## 2020-10-27 DIAGNOSIS — G4733 Obstructive sleep apnea (adult) (pediatric): Secondary | ICD-10-CM | POA: Diagnosis not present

## 2020-11-04 DIAGNOSIS — D649 Anemia, unspecified: Secondary | ICD-10-CM | POA: Diagnosis not present

## 2020-11-04 DIAGNOSIS — U071 COVID-19: Secondary | ICD-10-CM | POA: Diagnosis not present

## 2020-11-04 DIAGNOSIS — Z5181 Encounter for therapeutic drug level monitoring: Secondary | ICD-10-CM | POA: Diagnosis not present

## 2020-11-04 DIAGNOSIS — E669 Obesity, unspecified: Secondary | ICD-10-CM | POA: Diagnosis not present

## 2020-11-04 DIAGNOSIS — N1832 Chronic kidney disease, stage 3b: Secondary | ICD-10-CM | POA: Diagnosis not present

## 2020-11-04 DIAGNOSIS — R739 Hyperglycemia, unspecified: Secondary | ICD-10-CM | POA: Diagnosis not present

## 2020-11-04 DIAGNOSIS — K219 Gastro-esophageal reflux disease without esophagitis: Secondary | ICD-10-CM | POA: Diagnosis not present

## 2020-11-04 DIAGNOSIS — E7800 Pure hypercholesterolemia, unspecified: Secondary | ICD-10-CM | POA: Diagnosis not present

## 2020-11-04 DIAGNOSIS — I10 Essential (primary) hypertension: Secondary | ICD-10-CM | POA: Diagnosis not present

## 2021-01-19 DIAGNOSIS — G4733 Obstructive sleep apnea (adult) (pediatric): Secondary | ICD-10-CM | POA: Diagnosis not present

## 2021-02-13 DIAGNOSIS — N1832 Chronic kidney disease, stage 3b: Secondary | ICD-10-CM | POA: Diagnosis not present

## 2021-02-13 DIAGNOSIS — N189 Chronic kidney disease, unspecified: Secondary | ICD-10-CM | POA: Diagnosis not present

## 2021-02-13 DIAGNOSIS — I129 Hypertensive chronic kidney disease with stage 1 through stage 4 chronic kidney disease, or unspecified chronic kidney disease: Secondary | ICD-10-CM | POA: Diagnosis not present

## 2021-02-13 DIAGNOSIS — N2581 Secondary hyperparathyroidism of renal origin: Secondary | ICD-10-CM | POA: Diagnosis not present

## 2021-02-13 DIAGNOSIS — D631 Anemia in chronic kidney disease: Secondary | ICD-10-CM | POA: Diagnosis not present

## 2021-04-19 DIAGNOSIS — G4733 Obstructive sleep apnea (adult) (pediatric): Secondary | ICD-10-CM | POA: Diagnosis not present

## 2021-05-10 DIAGNOSIS — N529 Male erectile dysfunction, unspecified: Secondary | ICD-10-CM | POA: Diagnosis not present

## 2021-05-10 DIAGNOSIS — Z6832 Body mass index (BMI) 32.0-32.9, adult: Secondary | ICD-10-CM | POA: Diagnosis not present

## 2021-05-10 DIAGNOSIS — K219 Gastro-esophageal reflux disease without esophagitis: Secondary | ICD-10-CM | POA: Diagnosis not present

## 2021-05-10 DIAGNOSIS — G8929 Other chronic pain: Secondary | ICD-10-CM | POA: Diagnosis not present

## 2021-05-10 DIAGNOSIS — R030 Elevated blood-pressure reading, without diagnosis of hypertension: Secondary | ICD-10-CM | POA: Diagnosis not present

## 2021-05-10 DIAGNOSIS — Z8249 Family history of ischemic heart disease and other diseases of the circulatory system: Secondary | ICD-10-CM | POA: Diagnosis not present

## 2021-05-10 DIAGNOSIS — E669 Obesity, unspecified: Secondary | ICD-10-CM | POA: Diagnosis not present

## 2021-05-10 DIAGNOSIS — E785 Hyperlipidemia, unspecified: Secondary | ICD-10-CM | POA: Diagnosis not present

## 2021-05-10 DIAGNOSIS — Z823 Family history of stroke: Secondary | ICD-10-CM | POA: Diagnosis not present

## 2021-05-15 DIAGNOSIS — E7800 Pure hypercholesterolemia, unspecified: Secondary | ICD-10-CM | POA: Diagnosis not present

## 2021-05-15 DIAGNOSIS — N1832 Chronic kidney disease, stage 3b: Secondary | ICD-10-CM | POA: Diagnosis not present

## 2021-05-15 DIAGNOSIS — E669 Obesity, unspecified: Secondary | ICD-10-CM | POA: Diagnosis not present

## 2021-05-15 DIAGNOSIS — D649 Anemia, unspecified: Secondary | ICD-10-CM | POA: Diagnosis not present

## 2021-05-15 DIAGNOSIS — I10 Essential (primary) hypertension: Secondary | ICD-10-CM | POA: Diagnosis not present

## 2021-05-15 DIAGNOSIS — R739 Hyperglycemia, unspecified: Secondary | ICD-10-CM | POA: Diagnosis not present

## 2021-05-15 DIAGNOSIS — K219 Gastro-esophageal reflux disease without esophagitis: Secondary | ICD-10-CM | POA: Diagnosis not present

## 2021-05-15 DIAGNOSIS — Z5181 Encounter for therapeutic drug level monitoring: Secondary | ICD-10-CM | POA: Diagnosis not present

## 2021-06-14 DIAGNOSIS — D225 Melanocytic nevi of trunk: Secondary | ICD-10-CM | POA: Diagnosis not present

## 2021-06-14 DIAGNOSIS — D2261 Melanocytic nevi of right upper limb, including shoulder: Secondary | ICD-10-CM | POA: Diagnosis not present

## 2021-06-14 DIAGNOSIS — L578 Other skin changes due to chronic exposure to nonionizing radiation: Secondary | ICD-10-CM | POA: Diagnosis not present

## 2021-06-14 DIAGNOSIS — L821 Other seborrheic keratosis: Secondary | ICD-10-CM | POA: Diagnosis not present

## 2021-06-14 DIAGNOSIS — L814 Other melanin hyperpigmentation: Secondary | ICD-10-CM | POA: Diagnosis not present

## 2021-06-14 DIAGNOSIS — L570 Actinic keratosis: Secondary | ICD-10-CM | POA: Diagnosis not present

## 2021-06-15 DIAGNOSIS — H903 Sensorineural hearing loss, bilateral: Secondary | ICD-10-CM | POA: Diagnosis not present

## 2021-09-13 DIAGNOSIS — G4733 Obstructive sleep apnea (adult) (pediatric): Secondary | ICD-10-CM | POA: Diagnosis not present

## 2021-10-11 DIAGNOSIS — N1832 Chronic kidney disease, stage 3b: Secondary | ICD-10-CM | POA: Diagnosis not present

## 2021-10-16 DIAGNOSIS — I129 Hypertensive chronic kidney disease with stage 1 through stage 4 chronic kidney disease, or unspecified chronic kidney disease: Secondary | ICD-10-CM | POA: Diagnosis not present

## 2021-10-16 DIAGNOSIS — N2581 Secondary hyperparathyroidism of renal origin: Secondary | ICD-10-CM | POA: Diagnosis not present

## 2021-10-16 DIAGNOSIS — N1832 Chronic kidney disease, stage 3b: Secondary | ICD-10-CM | POA: Diagnosis not present

## 2021-10-16 DIAGNOSIS — D631 Anemia in chronic kidney disease: Secondary | ICD-10-CM | POA: Diagnosis not present

## 2021-11-17 DIAGNOSIS — R739 Hyperglycemia, unspecified: Secondary | ICD-10-CM | POA: Diagnosis not present

## 2021-11-17 DIAGNOSIS — K219 Gastro-esophageal reflux disease without esophagitis: Secondary | ICD-10-CM | POA: Diagnosis not present

## 2021-11-17 DIAGNOSIS — E7800 Pure hypercholesterolemia, unspecified: Secondary | ICD-10-CM | POA: Diagnosis not present

## 2021-11-17 DIAGNOSIS — Z5181 Encounter for therapeutic drug level monitoring: Secondary | ICD-10-CM | POA: Diagnosis not present

## 2021-11-17 DIAGNOSIS — D649 Anemia, unspecified: Secondary | ICD-10-CM | POA: Diagnosis not present

## 2021-11-17 DIAGNOSIS — E669 Obesity, unspecified: Secondary | ICD-10-CM | POA: Diagnosis not present

## 2021-11-17 DIAGNOSIS — N1832 Chronic kidney disease, stage 3b: Secondary | ICD-10-CM | POA: Diagnosis not present

## 2021-11-17 DIAGNOSIS — I10 Essential (primary) hypertension: Secondary | ICD-10-CM | POA: Diagnosis not present

## 2021-12-01 DIAGNOSIS — Z0000 Encounter for general adult medical examination without abnormal findings: Secondary | ICD-10-CM | POA: Diagnosis not present

## 2021-12-01 DIAGNOSIS — Z23 Encounter for immunization: Secondary | ICD-10-CM | POA: Diagnosis not present

## 2022-07-09 ENCOUNTER — Ambulatory Visit (INDEPENDENT_AMBULATORY_CARE_PROVIDER_SITE_OTHER): Payer: Medicare PPO | Admitting: Physician Assistant

## 2022-07-09 ENCOUNTER — Encounter (INDEPENDENT_AMBULATORY_CARE_PROVIDER_SITE_OTHER): Payer: Self-pay | Admitting: Physician Assistant

## 2022-07-09 VITALS — BP 168/117 | HR 64 | Temp 97.0°F | Resp 18 | Ht 67.0 in | Wt 213.0 lb

## 2022-07-09 DIAGNOSIS — S46811A Strain of other muscles, fascia and tendons at shoulder and upper arm level, right arm, initial encounter: Secondary | ICD-10-CM

## 2022-07-09 MED ORDER — METHYLPREDNISOLONE 4 MG PO TBPK
ORAL_TABLET | ORAL | 0 refills | Status: AC
Start: 2022-07-09 — End: 2022-07-15

## 2022-07-09 NOTE — Progress Notes (Signed)
[facility]  CARE  PROGRESS NOTE     Patient: Frank Nelson   Date: 07/09/2022   MRN: [id number]       Laney Tufo is a 66 y.o. male      HISTORY     History obtained from: Patient    Chief Complaint   Patient presents with    Pain     Pt presents with right shoulder pain        HPI Pt presents with right shoulder pain for 3 days.  He denies any trauma that could have brought this on. Notes it has made sleeping very difficult.  The pain shoots down his right arm. Pressure over the part of his shoulder that is painful causes him relief. Ibuprofen and tylenol have not helped.     Review of Systems ROS negative unless noted in hpi     History:    Pertinent Past Medical, Surgical, Family and Social History were reviewed.        Current Outpatient Medications:     methylPREDNISolone (MEDROL DOSEPAK) 4 MG tablet, Follow package directions days 1-6.  Finish all medication as instructed., Disp: 21 tablet, Rfl: 0    Allergies   Allergen Reactions    Amoxicillin Diarrhea, Nausea And Vomiting and Nausea Only     Has patient had a PCN reaction causing immediate rash, facial/tongue/throat swelling, SOB or lightheadedness with hypotension: No  Has patient had a PCN reaction causing severe rash involving mucus membranes or skin necrosis: No  Has patient had a PCN reaction that required hospitalization: No  Has patient had a PCN reaction occurring within the last 10 years: No  If all of the above answers are "NO", then may proceed with Cephalosporin use.      Moxifloxacin Diarrhea, Nausea And Vomiting and Other (See Comments)     GI UPSET  GI UPSET      Shellfish-Derived Products Nausea And Vomiting    Sulfa Antibiotics Nausea And Vomiting       Medications and Allergies reviewed.    PHYSICAL EXAM     Vitals:    07/09/22 0957   BP: (!) 168/117   Pulse: 64   Resp: 18   Temp: 97 F (36.1 C)   SpO2: 99%   Weight: 96.6 kg (213 lb)   Height: 1.702 m ('5\' 7"'$ )       Physical Exam  Neck:      Comments: Leftward rotation limited,  extension limited, flexion limited. Horizontal leftward flexion limited.   No mid line tender ness. ROM of elbow and shoulder normal.  Pt reports improvement of pain with pressure on affected area.          UCC COURSE     There were no labs reviewed with this patient during the visit.    There were no x-rays reviewed with this patient during the visit.    No current facility-administered medications for this visit.       PROCEDURES     Procedures    MEDICAL DECISION MAKING     History, physical, labs/studies most consistent with trapezius pain as the diagnosis.    Chart Review:  Prior PCP, Specialist and/or ED notes reviewed today: No  Prior labs/images/studies reviewed today: No    Differential Diagnosis: strain, sprain, spasm         ASSESSMENT     Encounter Diagnosis   Name Primary?    Strain of right trapezius muscle, initial encounter Yes  PLAN      PLAN: pt has one kidney, prescribed medrol dosepak due to lack of need for renal dosing.   Pt provided referral to pt. Bp elevated likely from pain and 3 days of not sleeping.             Orders Placed This Encounter   Procedures    Ambulatory referral to Physical Therapy     Requested Prescriptions     Signed Prescriptions Disp Refills    methylPREDNISolone (MEDROL DOSEPAK) 4 MG tablet 21 tablet 0     Sig: Follow package directions days 1-6.  Finish all medication as instructed.       Discussed results and diagnosis with patient/family.  Reviewed warning signs for worsening condition, as well as, indications for follow-up with primary care physician and return to urgent care clinic.   Patient/family expressed understanding of instructions.     An After Visit Summary was provided to the patient.

## 2022-07-09 NOTE — Patient Instructions (Addendum)
Take the Medrol dosepak at the same time daily.   Do NOT take it within 8 hours of your last Ibuprofen/Motrin/Advil or Aleve/Naproxen.  And do not take these medications with the medication you were prescribed.     Follow up with physical therapy.

## 2022-07-17 ENCOUNTER — Other Ambulatory Visit: Payer: Self-pay

## 2022-07-17 ENCOUNTER — Emergency Department (HOSPITAL_COMMUNITY)
Admission: EM | Admit: 2022-07-17 | Discharge: 2022-07-18 | Disposition: A | Discharge status: Home / Self Care | Payer: Medicare PPO | Attending: Emergency Medicine | Admitting: Emergency Medicine

## 2022-07-17 ENCOUNTER — Encounter (HOSPITAL_COMMUNITY): Payer: Self-pay

## 2022-07-17 ENCOUNTER — Emergency Department (HOSPITAL_COMMUNITY): Payer: Medicare PPO

## 2022-07-17 DIAGNOSIS — Z79899 Other long term (current) drug therapy: Secondary | ICD-10-CM | POA: Insufficient documentation

## 2022-07-17 DIAGNOSIS — M25511 Pain in right shoulder: Secondary | ICD-10-CM | POA: Diagnosis present

## 2022-07-17 MED ORDER — HYDROCODONE-ACETAMINOPHEN 5-325 MG PO TABS
1.0000 | ORAL_TABLET | Freq: Once | ORAL | Status: AC
  Administered 2022-07-17: 1 via ORAL
  Filled 2022-07-17: qty 1

## 2022-07-17 MED ORDER — LIDOCAINE 5 % EX PTCH
1.0000 | MEDICATED_PATCH | CUTANEOUS | Status: DC
  Administered 2022-07-17: 1 via TRANSDERMAL
  Filled 2022-07-17 (×2): qty 1

## 2022-07-17 NOTE — ED Notes (Signed)
This patient came to green zone through the lobby stating " Ineed main meds immediately and I can not stand this pain anymore no one is helping me" this NT then stated that his care is under the triage nurse and he will have to talk to the techs out in the lobby

## 2022-07-17 NOTE — ED Provider Triage Note (Signed)
Emergency Medicine Provider Triage Evaluation Note  Daniel Patton , a 66 y.o. male  was evaluated in triage.  Pt complains of right shoulder pain for the past week.  Patient was walking dog and dog pulled and injured his right shoulder.  Patient was given steroids at urgent care with no relief.  Admits to edema.  Pain with movement..  Review of Systems  Positive: Shoulder pain Negative: fever  Physical Exam  BP (!) 158/109 (BP Location: Left Arm)   Pulse 82   Temp 97.7 F (36.5 C) (Oral)   Resp 20   Ht 5\' 7"  (1.702 m)   Wt 91.6 kg   SpO2 98%   BMI 31.64 kg/m  Gen:   Awake, no distress   Resp:  Normal effort  MSK:   Moves extremities without difficulty  Other:    Medical Decision Making  Medically screening exam initiated at 5:06 PM.  Appropriate orders placed.  Daniel Patton was informed that the remainder of the evaluation will be completed by another provider, this initial triage assessment does not replace that evaluation, and the importance of remaining in the ED until their evaluation is complete.  Daniel Deed, PA-C 07/17/22 [phone]

## 2022-07-17 NOTE — ED Triage Notes (Signed)
Reports 10/2 was walking dog and dog pulled and injured right shoulder.  Went to UC and got steriods with no relief of pain. Was told it was strain of trap.  Swelling noted.  Patient has been taking tylenol, robaxin with no relief.

## 2022-07-18 ENCOUNTER — Ambulatory Visit: Payer: BC Managed Care – PPO | Admitting: Physical Therapy

## 2022-07-18 MED ORDER — ACETAMINOPHEN 325 MG PO TABS: 650.0000 mg | ORAL_TABLET | Freq: Four times a day (QID) | ORAL | 0 refills | Status: AC | PRN

## 2022-07-18 MED ORDER — HYDROCODONE-ACETAMINOPHEN 5-325 MG PO TABS
1.0000 | ORAL_TABLET | Freq: Once | ORAL | Status: AC
  Administered 2022-07-18: 1 via ORAL
  Filled 2022-07-18: qty 1

## 2022-07-18 MED ORDER — KETOROLAC TROMETHAMINE 30 MG/ML IJ SOLN
30.0000 mg | Freq: Once | INTRAMUSCULAR | Status: AC
  Administered 2022-07-18: 30 mg via INTRAMUSCULAR
  Filled 2022-07-18: qty 1

## 2022-07-18 MED ORDER — HYDROCODONE-ACETAMINOPHEN 5-325 MG PO TABS: 1.0000 | ORAL_TABLET | Freq: Four times a day (QID) | ORAL | 0 refills | Status: AC | PRN

## 2022-07-18 MED ORDER — IBUPROFEN 800 MG PO TABS: 800.0000 mg | ORAL_TABLET | Freq: Three times a day (TID) | ORAL | 0 refills | Status: DC | PRN

## 2022-07-18 NOTE — ED Notes (Signed)
Patient did not respond when called for room.  

## 2022-07-18 NOTE — Discharge Instructions (Addendum)
Please follow-up with orthopedic specialist in the next 3 to 5 days for evaluation of your right shoulder.  Products were sent to your pharmacy for severe pain.  It is safe to take oxycodone 5-3 to 5 mg every 6 hours as needed for pain.  Please do not operate heavy machinery or drive when you take narcotics.  Please take Motrin 800 mg as needed every 6-8 hours for pain as well.  Try not to overuse the right shoulder.  Apply ice while resting.

## 2022-07-18 NOTE — ED Provider Notes (Signed)
[facility] EMERGENCY DEPARTMENT Provider Note   CSN: [id number] Arrival date & time: 07/17/22  1628     History  Chief Complaint  Patient presents with   Shoulder Pain    Daniel Patton is a 66 y.o. male.  Patient is a 66 year old male presenting for complaints of right-sided shoulder pain.  Patient states he has been having pain over the last week and a half after his arm was pulled by his dog when he was walking it on a leash.  Patient admits to pain in the right shoulder and the right shoulder blade.  Denies any sensation or motor deficits in the arm.  States his arm feels better when is held over his head.  States he was able to tolerate the pain up until last night.  Denies any previous injuries or operations on the right shoulder or arm.  Denies warmth, swelling, redness, or wounds.  The history is provided by the patient. No language interpreter was used.  Shoulder Pain Associated symptoms: no fever        Home Medications Prior to Admission medications   Medication Sig Start Date End Date Taking? Authorizing Provider  HYDROcodone-acetaminophen (NORCO) 5-325 MG tablet Take 1 tablet by mouth every 6 (six) hours as needed for up to 3 days for moderate pain. 07/18/22 07/21/22 Yes Edwin Dada P, DO  ibuprofen (ADVIL) 800 MG tablet Take 1 tablet (800 mg total) by mouth every 8 (eight) hours as needed for mild pain or moderate pain. 07/18/22  Yes Edwin Dada P, DO  acetaminophen (TYLENOL) 650 MG CR tablet Take 1,300 mg by mouth daily as needed for pain.    [provider]  amLODipine (NORVASC) 5 MG tablet Take 5 mg by mouth daily.    [provider]  cetirizine (ZYRTEC) 10 MG tablet Take 10 mg by mouth daily.    [provider]  Cholecalciferol (D 2000) 2000 units TABS Take 2,000 Units by mouth every other day.     [provider]  Garlic 1000 MG CAPS Take 1,000 mg by mouth daily.    [provider]  Melatonin 10 MG  TABS Take 10 mg by mouth at bedtime as needed (sleep).    [provider]  Misc Natural Products (GLUCOSAMINE CHONDROITIN TRIPLE) TABS Take 1 tablet by mouth daily.    [provider]  Multiple Vitamin (MULTIVITAMIN WITH MINERALS) TABS tablet Take 1 tablet by mouth daily. Men's 50+    [provider]  OVER THE COUNTER MEDICATION Place 1 Dose under the tongue at bedtime. CBD Oil    [provider]  pantoprazole (PROTONIX) 40 MG tablet Take 40 mg by mouth at bedtime.     [provider]  rosuvastatin (CRESTOR) 5 MG tablet Take 5 mg by mouth daily.    [provider]  Tetrahydrozoline HCl (VISINE OP) Place 1 drop into both eyes daily as needed (redness).    [provider]  Turmeric Curcumin 500 MG CAPS Take 1,000 mg by mouth daily.    [provider]  vitamin C (ASCORBIC ACID) 500 MG tablet Take 500 mg by mouth daily.    [provider]      Allergies    Amoxil [amoxicillin], Avelox [moxifloxacin], Shellfish-derived products, and Sulfa antibiotics    Review of Systems   Review of Systems  Constitutional:  Negative for chills and fever.  Skin:  Negative for color change and wound.  Neurological:  Negative for weakness and  numbness.    Physical Exam Updated Vital Signs BP (!) 147/114 (BP Location: Left Arm)   Pulse (!) 59   Temp 98 F (36.7 C)   Resp 18   Ht 5\' 7"  (1.702 m)   Wt 91.6 kg   SpO2 99%   BMI 31.64 kg/m  Physical Exam Vitals and nursing note reviewed.  Constitutional:      Appearance: Normal appearance.  HENT:     Head: Normocephalic and atraumatic.  Cardiovascular:     Rate and Rhythm: Normal rate and regular rhythm.     Pulses:          Radial pulses are 2+ on the right side.  Pulmonary:     Effort: Pulmonary effort is normal.     Breath sounds: Normal breath sounds.  Musculoskeletal:     Right shoulder: Bony tenderness present. No swelling, deformity or effusion. Decreased range  of motion. Normal pulse.     Right elbow: Normal.     Right forearm: Normal.     Right wrist: Normal.     Right hand: Normal.       Arms:     Cervical back: Tenderness present. No bony tenderness.     Thoracic back: No bony tenderness.       Back:  Skin:    General: Skin is warm and dry.     Capillary Refill: Capillary refill takes less than 2 seconds.     Findings: No ecchymosis, erythema or rash.  Neurological:     General: No focal deficit present.     Mental Status: He is alert.     Sensory: Sensation is intact.     Motor: Motor function is intact.     ED Results / Procedures / Treatments   Labs (all labs ordered are listed, but only abnormal results are displayed) Labs Reviewed - No data to display  EKG None  Radiology DG Shoulder Right  Result Date: 07/17/2022 CLINICAL DATA:  Shoulder injury EXAM: RIGHT SHOULDER-3 VIEW COMPARISON:  None Available. FINDINGS: There is no evidence of fracture or dislocation. There is no evidence of arthropathy or other focal bone abnormality. Soft tissues are unremarkable. IMPRESSION: Negative. Electronically Signed   By: Merilyn Baba M.D.   On: 07/17/2022 17:41    Procedures Procedures    Medications Ordered in ED Medications  lidocaine (LIDODERM) 5 % 1 patch (1 patch Transdermal Patch Applied 07/17/22 1721)  ketorolac (TORADOL) 30 MG/ML injection 30 mg (has no administration in time range)  HYDROcodone-acetaminophen (NORCO/VICODIN) 5-325 MG per tablet 1 tablet (has no administration in time range)  HYDROcodone-acetaminophen (NORCO/VICODIN) 5-325 MG per tablet 1 tablet (1 tablet Oral Given 07/17/22 1721)    ED Course/ Medical Decision Making/ A&P                           Medical Decision Making  8:39 AM  Differential diagnosis includes but is not limited to rotator cuff tear, dislocation, fracture, DVT, etc.  66 year old male presenting for complaints of right-sided shoulder pain.  Patient is alert and oriented x3, no  acute distress, afebrile, stable vital signs.  Patient has pain to palpation of the joint capsule of the right shoulder as well as the right shoulder blade.  No swelling, warmth, ecchymosis, erythema, or wounds.  Patient has limited range of motion in the right upper extremity due to pain however is neurovascularly intact.  Pain is exacerbated with movements.  X-rays demonstrates no  dislocations or fractures.  No edema.  Patient given medication for pain control with recommendations for prompt follow-up with orthopedic specialist.  Patient in no distress and overall condition improved here in the ED. Detailed discussions were had with the patient regarding current findings, and need for close f/u with PCP or on call doctor. The patient has been instructed to return immediately if the symptoms worsen in any way for re-evaluation. Patient verbalized understanding and is in agreement with current care plan. All questions answered prior to discharge.         Final Clinical Impression(s) / ED Diagnoses Final diagnoses:  Acute pain of right shoulder    Rx / DC Orders ED Discharge Orders          Ordered    HYDROcodone-acetaminophen (NORCO) 5-325 MG tablet  Every 6 hours PRN        07/18/22 0839    ibuprofen (ADVIL) 800 MG tablet  Every 8 hours PRN        07/18/22 0839              Franne Forts, DO 07/18/22 [id number]

## 2022-07-30 ENCOUNTER — Other Ambulatory Visit: Payer: Self-pay

## 2022-07-30 ENCOUNTER — Encounter: Payer: Self-pay | Admitting: Physical Therapy

## 2022-07-30 ENCOUNTER — Ambulatory Visit: Payer: Medicare PPO | Attending: Physician Assistant | Admitting: Physical Therapy

## 2022-07-30 DIAGNOSIS — M6281 Muscle weakness (generalized): Secondary | ICD-10-CM | POA: Diagnosis present

## 2022-07-30 DIAGNOSIS — M542 Cervicalgia: Secondary | ICD-10-CM | POA: Insufficient documentation

## 2022-07-30 DIAGNOSIS — R293 Abnormal posture: Secondary | ICD-10-CM | POA: Insufficient documentation
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
## 2022-08-01 ENCOUNTER — Ambulatory Visit: Payer: Medicare PPO | Admitting: Physical Therapy

## 2022-08-01 ENCOUNTER — Encounter: Payer: Self-pay | Admitting: Physical Therapy

## 2022-08-01 DIAGNOSIS — R293 Abnormal posture: Secondary | ICD-10-CM

## 2022-08-01 DIAGNOSIS — M542 Cervicalgia: Secondary | ICD-10-CM

## 2022-08-01 DIAGNOSIS — M6281 Muscle weakness (generalized): Secondary | ICD-10-CM
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
## 2022-08-03 ENCOUNTER — Ambulatory Visit: Payer: Medicare PPO | Admitting: Physical Therapy

## 2022-08-03 ENCOUNTER — Encounter: Payer: Self-pay | Admitting: Physical Therapy

## 2022-08-03 DIAGNOSIS — M542 Cervicalgia: Secondary | ICD-10-CM

## 2022-08-03 DIAGNOSIS — R293 Abnormal posture: Secondary | ICD-10-CM

## 2022-08-03 DIAGNOSIS — M6281 Muscle weakness (generalized): Secondary | ICD-10-CM
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
## 2022-08-06 ENCOUNTER — Encounter: Payer: Self-pay | Admitting: Physical Therapy

## 2022-08-06 ENCOUNTER — Ambulatory Visit: Payer: Medicare PPO | Admitting: Physical Therapy

## 2022-08-06 DIAGNOSIS — M6281 Muscle weakness (generalized): Secondary | ICD-10-CM

## 2022-08-06 DIAGNOSIS — R293 Abnormal posture: Secondary | ICD-10-CM

## 2022-08-06 DIAGNOSIS — M542 Cervicalgia: Secondary | ICD-10-CM | POA: Diagnosis not present
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
## 2022-08-08 ENCOUNTER — Ambulatory Visit: Payer: Medicare PPO | Attending: Physician Assistant | Admitting: Physical Therapy

## 2022-08-08 ENCOUNTER — Encounter: Payer: Self-pay | Admitting: Physical Therapy

## 2022-08-08 DIAGNOSIS — M542 Cervicalgia: Secondary | ICD-10-CM | POA: Diagnosis present

## 2022-08-08 DIAGNOSIS — R293 Abnormal posture: Secondary | ICD-10-CM | POA: Diagnosis present

## 2022-08-08 DIAGNOSIS — M6281 Muscle weakness (generalized): Secondary | ICD-10-CM | POA: Insufficient documentation
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
## 2022-08-10 ENCOUNTER — Encounter: Payer: Self-pay | Admitting: Physical Therapy

## 2022-08-10 ENCOUNTER — Ambulatory Visit: Payer: Medicare PPO | Admitting: Physical Therapy

## 2022-08-10 DIAGNOSIS — M6281 Muscle weakness (generalized): Secondary | ICD-10-CM

## 2022-08-10 DIAGNOSIS — M542 Cervicalgia: Secondary | ICD-10-CM

## 2022-08-10 DIAGNOSIS — R293 Abnormal posture: Secondary | ICD-10-CM
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
## 2022-08-13 ENCOUNTER — Encounter: Payer: Self-pay | Admitting: Physical Therapy

## 2022-08-13 ENCOUNTER — Ambulatory Visit: Payer: Medicare PPO | Admitting: Physical Therapy

## 2022-08-13 DIAGNOSIS — M6281 Muscle weakness (generalized): Secondary | ICD-10-CM

## 2022-08-13 DIAGNOSIS — R293 Abnormal posture: Secondary | ICD-10-CM

## 2022-08-13 DIAGNOSIS — M542 Cervicalgia: Secondary | ICD-10-CM
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
## 2022-08-15 ENCOUNTER — Ambulatory Visit: Payer: Medicare PPO | Admitting: Physical Therapy

## 2022-08-15 DIAGNOSIS — M542 Cervicalgia: Secondary | ICD-10-CM

## 2022-08-15 DIAGNOSIS — R293 Abnormal posture: Secondary | ICD-10-CM
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
## 2022-08-17 ENCOUNTER — Encounter: Payer: Self-pay | Admitting: Physical Therapy

## 2022-08-17 ENCOUNTER — Ambulatory Visit: Payer: Medicare PPO | Admitting: Physical Therapy

## 2022-08-17 DIAGNOSIS — M542 Cervicalgia: Secondary | ICD-10-CM

## 2022-08-17 DIAGNOSIS — R293 Abnormal posture: Secondary | ICD-10-CM

## 2022-08-17 DIAGNOSIS — M6281 Muscle weakness (generalized): Secondary | ICD-10-CM
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
## 2022-08-21 ENCOUNTER — Ambulatory Visit: Payer: Medicare PPO | Admitting: Physical Therapy

## 2022-08-21 DIAGNOSIS — M542 Cervicalgia: Secondary | ICD-10-CM | POA: Diagnosis not present

## 2022-08-21 DIAGNOSIS — R293 Abnormal posture: Secondary | ICD-10-CM
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
## 2022-08-23 ENCOUNTER — Encounter: Payer: Self-pay | Admitting: Physical Therapy

## 2022-08-23 ENCOUNTER — Ambulatory Visit: Payer: Medicare PPO | Admitting: Physical Therapy

## 2022-08-23 DIAGNOSIS — R293 Abnormal posture: Secondary | ICD-10-CM

## 2022-08-23 DIAGNOSIS — M542 Cervicalgia: Secondary | ICD-10-CM

## 2022-08-23 DIAGNOSIS — M6281 Muscle weakness (generalized): Secondary | ICD-10-CM
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
## 2022-08-27 ENCOUNTER — Ambulatory Visit: Payer: Medicare PPO | Admitting: Physical Therapy

## 2022-08-27 ENCOUNTER — Encounter: Payer: Self-pay | Admitting: Physical Therapy

## 2022-08-27 DIAGNOSIS — M6281 Muscle weakness (generalized): Secondary | ICD-10-CM

## 2022-08-27 DIAGNOSIS — M542 Cervicalgia: Secondary | ICD-10-CM | POA: Diagnosis not present

## 2022-08-27 DIAGNOSIS — R293 Abnormal posture: Secondary | ICD-10-CM

## 2022-08-27 NOTE — Addendum Note (Signed)
Addended by: Courtny Bennison, [place] W on: 08/27/2022 03:13 PM   Modules accepted: Orders
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
## 2022-09-03 ENCOUNTER — Ambulatory Visit: Payer: Medicare PPO | Admitting: Physical Therapy

## 2022-09-03 DIAGNOSIS — R293 Abnormal posture: Secondary | ICD-10-CM

## 2022-09-03 DIAGNOSIS — M542 Cervicalgia: Secondary | ICD-10-CM | POA: Diagnosis not present
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
## 2022-09-07 ENCOUNTER — Encounter: Payer: Self-pay | Admitting: Physical Therapy

## 2022-09-07 ENCOUNTER — Ambulatory Visit: Payer: Medicare PPO | Attending: Physician Assistant | Admitting: Physical Therapy

## 2022-09-07 DIAGNOSIS — M542 Cervicalgia: Secondary | ICD-10-CM | POA: Diagnosis not present

## 2022-09-07 DIAGNOSIS — R293 Abnormal posture: Secondary | ICD-10-CM | POA: Insufficient documentation

## 2022-09-07 DIAGNOSIS — M6281 Muscle weakness (generalized): Secondary | ICD-10-CM | POA: Insufficient documentation
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
## 2022-09-10 ENCOUNTER — Encounter: Payer: Self-pay | Admitting: Physical Therapy

## 2022-09-10 ENCOUNTER — Ambulatory Visit: Payer: Medicare PPO | Admitting: Physical Therapy

## 2022-09-10 DIAGNOSIS — M6281 Muscle weakness (generalized): Secondary | ICD-10-CM | POA: Diagnosis not present

## 2022-09-10 DIAGNOSIS — R293 Abnormal posture: Secondary | ICD-10-CM | POA: Diagnosis not present

## 2022-09-10 DIAGNOSIS — M542 Cervicalgia: Secondary | ICD-10-CM
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
## 2022-09-12 ENCOUNTER — Encounter: Payer: Self-pay | Admitting: Physical Therapy

## 2022-09-12 ENCOUNTER — Ambulatory Visit: Payer: Medicare PPO | Admitting: Physical Therapy

## 2022-09-12 DIAGNOSIS — M542 Cervicalgia: Secondary | ICD-10-CM

## 2022-09-12 DIAGNOSIS — M6281 Muscle weakness (generalized): Secondary | ICD-10-CM | POA: Diagnosis not present

## 2022-09-12 DIAGNOSIS — R293 Abnormal posture: Secondary | ICD-10-CM

## 2022-09-12 DIAGNOSIS — G4733 Obstructive sleep apnea (adult) (pediatric): Secondary | ICD-10-CM | POA: Diagnosis not present
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
## 2022-09-13 DIAGNOSIS — M5412 Radiculopathy, cervical region: Secondary | ICD-10-CM | POA: Diagnosis not present

## 2022-09-13 DIAGNOSIS — Z6833 Body mass index (BMI) 33.0-33.9, adult: Secondary | ICD-10-CM | POA: Diagnosis not present

## 2022-10-23 DIAGNOSIS — G4733 Obstructive sleep apnea (adult) (pediatric): Secondary | ICD-10-CM | POA: Diagnosis not present

## 2022-11-01 DIAGNOSIS — Z6834 Body mass index (BMI) 34.0-34.9, adult: Secondary | ICD-10-CM | POA: Diagnosis not present

## 2022-11-01 DIAGNOSIS — M5412 Radiculopathy, cervical region: Secondary | ICD-10-CM | POA: Diagnosis not present

## 2022-11-07 DIAGNOSIS — G4733 Obstructive sleep apnea (adult) (pediatric): Secondary | ICD-10-CM | POA: Diagnosis not present

## 2022-11-12 ENCOUNTER — Ambulatory Visit: Payer: Medicare PPO | Attending: Student | Admitting: Physical Therapy

## 2022-11-12 ENCOUNTER — Encounter: Payer: Self-pay | Admitting: Physical Therapy

## 2022-11-12 DIAGNOSIS — M6281 Muscle weakness (generalized): Secondary | ICD-10-CM | POA: Diagnosis not present

## 2022-11-12 DIAGNOSIS — R293 Abnormal posture: Secondary | ICD-10-CM | POA: Diagnosis not present

## 2022-11-12 DIAGNOSIS — M542 Cervicalgia: Secondary | ICD-10-CM | POA: Insufficient documentation
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
## 2022-11-15 ENCOUNTER — Ambulatory Visit: Payer: Medicare PPO | Admitting: Physical Therapy

## 2022-11-15 DIAGNOSIS — R293 Abnormal posture: Secondary | ICD-10-CM | POA: Diagnosis not present

## 2022-11-15 DIAGNOSIS — M542 Cervicalgia: Secondary | ICD-10-CM

## 2022-11-15 DIAGNOSIS — M6281 Muscle weakness (generalized): Secondary | ICD-10-CM
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
## 2022-11-19 ENCOUNTER — Ambulatory Visit: Payer: Medicare PPO | Admitting: Physical Therapy

## 2022-11-22 ENCOUNTER — Ambulatory Visit: Payer: Medicare PPO | Admitting: Physical Therapy

## 2022-11-22 DIAGNOSIS — R293 Abnormal posture: Secondary | ICD-10-CM

## 2022-11-22 DIAGNOSIS — M6281 Muscle weakness (generalized): Secondary | ICD-10-CM | POA: Diagnosis not present

## 2022-11-22 DIAGNOSIS — M542 Cervicalgia: Secondary | ICD-10-CM | POA: Diagnosis not present
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
## 2022-11-23 DIAGNOSIS — N1832 Chronic kidney disease, stage 3b: Secondary | ICD-10-CM | POA: Diagnosis not present

## 2022-11-26 ENCOUNTER — Ambulatory Visit: Payer: Medicare PPO | Admitting: Physical Therapy

## 2022-11-26 ENCOUNTER — Encounter: Payer: Self-pay | Admitting: Physical Therapy

## 2022-11-26 DIAGNOSIS — R293 Abnormal posture: Secondary | ICD-10-CM | POA: Diagnosis not present

## 2022-11-26 DIAGNOSIS — M6281 Muscle weakness (generalized): Secondary | ICD-10-CM

## 2022-11-26 DIAGNOSIS — M542 Cervicalgia: Secondary | ICD-10-CM
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
## 2022-11-28 ENCOUNTER — Ambulatory Visit: Payer: Medicare PPO | Admitting: Physical Therapy

## 2022-11-28 DIAGNOSIS — N1832 Chronic kidney disease, stage 3b: Secondary | ICD-10-CM | POA: Diagnosis not present

## 2022-11-28 DIAGNOSIS — M542 Cervicalgia: Secondary | ICD-10-CM | POA: Diagnosis not present

## 2022-11-28 DIAGNOSIS — I129 Hypertensive chronic kidney disease with stage 1 through stage 4 chronic kidney disease, or unspecified chronic kidney disease: Secondary | ICD-10-CM | POA: Diagnosis not present

## 2022-11-28 DIAGNOSIS — R293 Abnormal posture: Secondary | ICD-10-CM

## 2022-11-28 DIAGNOSIS — M6281 Muscle weakness (generalized): Secondary | ICD-10-CM | POA: Diagnosis not present

## 2022-11-28 DIAGNOSIS — N2581 Secondary hyperparathyroidism of renal origin: Secondary | ICD-10-CM | POA: Diagnosis not present

## 2022-11-28 DIAGNOSIS — D631 Anemia in chronic kidney disease: Secondary | ICD-10-CM | POA: Diagnosis not present
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
## 2022-12-03 ENCOUNTER — Ambulatory Visit: Payer: Medicare PPO | Admitting: Physical Therapy

## 2022-12-03 DIAGNOSIS — Z0000 Encounter for general adult medical examination without abnormal findings: Secondary | ICD-10-CM | POA: Diagnosis not present

## 2022-12-03 DIAGNOSIS — K219 Gastro-esophageal reflux disease without esophagitis: Secondary | ICD-10-CM | POA: Diagnosis not present

## 2022-12-03 DIAGNOSIS — I10 Essential (primary) hypertension: Secondary | ICD-10-CM | POA: Diagnosis not present

## 2022-12-03 DIAGNOSIS — N2581 Secondary hyperparathyroidism of renal origin: Secondary | ICD-10-CM | POA: Diagnosis not present

## 2022-12-03 DIAGNOSIS — N1832 Chronic kidney disease, stage 3b: Secondary | ICD-10-CM | POA: Diagnosis not present

## 2022-12-03 DIAGNOSIS — Z6833 Body mass index (BMI) 33.0-33.9, adult: Secondary | ICD-10-CM | POA: Diagnosis not present

## 2022-12-03 DIAGNOSIS — E7800 Pure hypercholesterolemia, unspecified: Secondary | ICD-10-CM | POA: Diagnosis not present

## 2022-12-04 ENCOUNTER — Ambulatory Visit: Payer: Medicare PPO | Admitting: Physical Therapy

## 2022-12-04 DIAGNOSIS — M6281 Muscle weakness (generalized): Secondary | ICD-10-CM | POA: Diagnosis not present

## 2022-12-04 DIAGNOSIS — M542 Cervicalgia: Secondary | ICD-10-CM

## 2022-12-04 DIAGNOSIS — R293 Abnormal posture: Secondary | ICD-10-CM | POA: Diagnosis not present
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
## 2022-12-05 ENCOUNTER — Encounter: Payer: Medicare PPO | Admitting: Physical Therapy

## 2022-12-06 DIAGNOSIS — G4733 Obstructive sleep apnea (adult) (pediatric): Secondary | ICD-10-CM | POA: Diagnosis not present

## 2022-12-07 ENCOUNTER — Ambulatory Visit: Payer: Medicare PPO | Attending: Student | Admitting: Physical Therapy

## 2022-12-07 DIAGNOSIS — R293 Abnormal posture: Secondary | ICD-10-CM | POA: Insufficient documentation

## 2022-12-07 DIAGNOSIS — M6281 Muscle weakness (generalized): Secondary | ICD-10-CM

## 2022-12-07 DIAGNOSIS — M542 Cervicalgia: Secondary | ICD-10-CM | POA: Insufficient documentation
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
## 2022-12-10 ENCOUNTER — Ambulatory Visit: Payer: Medicare PPO | Admitting: *Deleted

## 2022-12-10 ENCOUNTER — Encounter: Payer: Self-pay | Admitting: *Deleted

## 2022-12-10 DIAGNOSIS — M6281 Muscle weakness (generalized): Secondary | ICD-10-CM

## 2022-12-10 DIAGNOSIS — R293 Abnormal posture: Secondary | ICD-10-CM | POA: Diagnosis not present

## 2022-12-10 DIAGNOSIS — M542 Cervicalgia: Secondary | ICD-10-CM
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
## 2022-12-13 ENCOUNTER — Encounter: Payer: Self-pay | Admitting: Physical Therapy

## 2022-12-13 ENCOUNTER — Ambulatory Visit: Payer: Medicare PPO | Admitting: Physical Therapy

## 2022-12-13 DIAGNOSIS — M6281 Muscle weakness (generalized): Secondary | ICD-10-CM

## 2022-12-13 DIAGNOSIS — R293 Abnormal posture: Secondary | ICD-10-CM

## 2022-12-13 DIAGNOSIS — M542 Cervicalgia: Secondary | ICD-10-CM | POA: Diagnosis not present
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
## 2022-12-18 ENCOUNTER — Encounter: Payer: Medicare PPO | Admitting: Physical Therapy

## 2022-12-20 ENCOUNTER — Ambulatory Visit: Payer: Medicare PPO | Admitting: Physical Therapy

## 2022-12-20 DIAGNOSIS — M542 Cervicalgia: Secondary | ICD-10-CM

## 2022-12-20 DIAGNOSIS — R293 Abnormal posture: Secondary | ICD-10-CM | POA: Diagnosis not present

## 2022-12-20 DIAGNOSIS — M6281 Muscle weakness (generalized): Secondary | ICD-10-CM | POA: Diagnosis not present
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
## 2022-12-25 DIAGNOSIS — G4733 Obstructive sleep apnea (adult) (pediatric): Secondary | ICD-10-CM | POA: Diagnosis not present

## 2023-01-06 DIAGNOSIS — G4733 Obstructive sleep apnea (adult) (pediatric): Secondary | ICD-10-CM | POA: Diagnosis not present

## 2023-02-05 DIAGNOSIS — G4733 Obstructive sleep apnea (adult) (pediatric): Secondary | ICD-10-CM | POA: Diagnosis not present

## 2023-02-22 DIAGNOSIS — G4733 Obstructive sleep apnea (adult) (pediatric): Secondary | ICD-10-CM | POA: Diagnosis not present

## 2023-03-08 DIAGNOSIS — G4733 Obstructive sleep apnea (adult) (pediatric): Secondary | ICD-10-CM | POA: Diagnosis not present

## 2023-04-07 DIAGNOSIS — G4733 Obstructive sleep apnea (adult) (pediatric): Secondary | ICD-10-CM | POA: Diagnosis not present

## 2023-05-08 DIAGNOSIS — G4733 Obstructive sleep apnea (adult) (pediatric): Secondary | ICD-10-CM | POA: Diagnosis not present

## 2023-05-28 DIAGNOSIS — G4733 Obstructive sleep apnea (adult) (pediatric): Secondary | ICD-10-CM | POA: Diagnosis not present

## 2023-06-08 DIAGNOSIS — G4733 Obstructive sleep apnea (adult) (pediatric): Secondary | ICD-10-CM | POA: Diagnosis not present

## 2023-07-03 DIAGNOSIS — L821 Other seborrheic keratosis: Secondary | ICD-10-CM | POA: Diagnosis not present

## 2023-07-03 DIAGNOSIS — L570 Actinic keratosis: Secondary | ICD-10-CM | POA: Diagnosis not present

## 2023-07-03 DIAGNOSIS — D2261 Melanocytic nevi of right upper limb, including shoulder: Secondary | ICD-10-CM | POA: Diagnosis not present

## 2023-07-03 DIAGNOSIS — L814 Other melanin hyperpigmentation: Secondary | ICD-10-CM | POA: Diagnosis not present

## 2023-07-03 DIAGNOSIS — L578 Other skin changes due to chronic exposure to nonionizing radiation: Secondary | ICD-10-CM | POA: Diagnosis not present

## 2023-07-03 DIAGNOSIS — D225 Melanocytic nevi of trunk: Secondary | ICD-10-CM | POA: Diagnosis not present

## 2023-07-08 DIAGNOSIS — G4733 Obstructive sleep apnea (adult) (pediatric): Secondary | ICD-10-CM | POA: Diagnosis not present

## 2023-07-22 ENCOUNTER — Ambulatory Visit: Payer: Medicare PPO | Admitting: Physical Therapy

## 2023-07-24 ENCOUNTER — Other Ambulatory Visit: Payer: Self-pay

## 2023-07-24 ENCOUNTER — Ambulatory Visit: Payer: Medicare PPO | Attending: Neurosurgery | Admitting: Physical Therapy

## 2023-07-24 DIAGNOSIS — R293 Abnormal posture: Secondary | ICD-10-CM | POA: Insufficient documentation

## 2023-07-24 DIAGNOSIS — M6281 Muscle weakness (generalized): Secondary | ICD-10-CM | POA: Diagnosis not present

## 2023-07-24 DIAGNOSIS — M542 Cervicalgia: Secondary | ICD-10-CM

## 2023-07-24 DIAGNOSIS — M62838 Other muscle spasm: Secondary | ICD-10-CM
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
## 2023-07-25 ENCOUNTER — Ambulatory Visit: Payer: Medicare PPO | Admitting: Physical Therapy

## 2023-07-25 DIAGNOSIS — M6281 Muscle weakness (generalized): Secondary | ICD-10-CM | POA: Diagnosis not present

## 2023-07-25 DIAGNOSIS — M62838 Other muscle spasm: Secondary | ICD-10-CM | POA: Diagnosis not present

## 2023-07-25 DIAGNOSIS — M542 Cervicalgia: Secondary | ICD-10-CM

## 2023-07-25 DIAGNOSIS — R293 Abnormal posture: Secondary | ICD-10-CM | POA: Diagnosis not present
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
## 2023-07-30 ENCOUNTER — Ambulatory Visit: Payer: Medicare PPO | Admitting: Physical Therapy

## 2023-07-30 DIAGNOSIS — M6281 Muscle weakness (generalized): Secondary | ICD-10-CM | POA: Diagnosis not present

## 2023-07-30 DIAGNOSIS — M62838 Other muscle spasm: Secondary | ICD-10-CM

## 2023-07-30 DIAGNOSIS — M542 Cervicalgia: Secondary | ICD-10-CM

## 2023-07-30 DIAGNOSIS — R293 Abnormal posture: Secondary | ICD-10-CM | POA: Diagnosis not present
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
## 2023-08-01 ENCOUNTER — Ambulatory Visit: Payer: Medicare PPO | Admitting: *Deleted

## 2023-08-01 DIAGNOSIS — M62838 Other muscle spasm: Secondary | ICD-10-CM | POA: Diagnosis not present

## 2023-08-01 DIAGNOSIS — M542 Cervicalgia: Secondary | ICD-10-CM | POA: Diagnosis not present

## 2023-08-01 DIAGNOSIS — M6281 Muscle weakness (generalized): Secondary | ICD-10-CM | POA: Diagnosis not present

## 2023-08-01 DIAGNOSIS — R293 Abnormal posture: Secondary | ICD-10-CM | POA: Diagnosis not present
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
## 2023-08-06 ENCOUNTER — Ambulatory Visit: Payer: Medicare PPO | Admitting: *Deleted

## 2023-08-07 ENCOUNTER — Ambulatory Visit: Payer: Medicare PPO | Admitting: Physical Therapy

## 2023-08-07 DIAGNOSIS — M62838 Other muscle spasm: Secondary | ICD-10-CM | POA: Diagnosis not present

## 2023-08-07 DIAGNOSIS — M542 Cervicalgia: Secondary | ICD-10-CM

## 2023-08-07 DIAGNOSIS — R293 Abnormal posture: Secondary | ICD-10-CM

## 2023-08-07 DIAGNOSIS — M6281 Muscle weakness (generalized): Secondary | ICD-10-CM | POA: Diagnosis not present
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
## 2023-08-08 ENCOUNTER — Ambulatory Visit: Payer: Medicare PPO | Admitting: Physical Therapy

## 2023-08-08 DIAGNOSIS — M62838 Other muscle spasm: Secondary | ICD-10-CM | POA: Diagnosis not present

## 2023-08-08 DIAGNOSIS — R293 Abnormal posture: Secondary | ICD-10-CM

## 2023-08-08 DIAGNOSIS — G4733 Obstructive sleep apnea (adult) (pediatric): Secondary | ICD-10-CM | POA: Diagnosis not present

## 2023-08-08 DIAGNOSIS — M6281 Muscle weakness (generalized): Secondary | ICD-10-CM | POA: Diagnosis not present

## 2023-08-08 DIAGNOSIS — M542 Cervicalgia: Secondary | ICD-10-CM
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
## 2023-08-12 ENCOUNTER — Ambulatory Visit: Payer: Medicare PPO | Admitting: Physical Therapy

## 2023-08-14 ENCOUNTER — Ambulatory Visit: Payer: Medicare PPO | Attending: Neurosurgery

## 2023-08-14 DIAGNOSIS — M542 Cervicalgia: Secondary | ICD-10-CM | POA: Diagnosis not present

## 2023-08-14 DIAGNOSIS — R293 Abnormal posture: Secondary | ICD-10-CM | POA: Diagnosis not present

## 2023-08-14 DIAGNOSIS — M62838 Other muscle spasm: Secondary | ICD-10-CM | POA: Diagnosis not present

## 2023-08-14 DIAGNOSIS — M6281 Muscle weakness (generalized): Secondary | ICD-10-CM | POA: Insufficient documentation
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
## 2023-08-15 ENCOUNTER — Ambulatory Visit: Payer: Medicare PPO | Admitting: *Deleted

## 2023-08-15 DIAGNOSIS — M542 Cervicalgia: Secondary | ICD-10-CM

## 2023-08-15 DIAGNOSIS — R293 Abnormal posture: Secondary | ICD-10-CM

## 2023-08-15 DIAGNOSIS — M62838 Other muscle spasm: Secondary | ICD-10-CM | POA: Diagnosis not present

## 2023-08-15 DIAGNOSIS — M6281 Muscle weakness (generalized): Secondary | ICD-10-CM | POA: Diagnosis not present
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
## 2023-08-26 DIAGNOSIS — G4733 Obstructive sleep apnea (adult) (pediatric): Secondary | ICD-10-CM | POA: Diagnosis not present

## 2023-09-07 DIAGNOSIS — G4733 Obstructive sleep apnea (adult) (pediatric): Secondary | ICD-10-CM | POA: Diagnosis not present

## 2023-10-08 DIAGNOSIS — G4733 Obstructive sleep apnea (adult) (pediatric): Secondary | ICD-10-CM | POA: Diagnosis not present

## 2023-11-08 DIAGNOSIS — G4733 Obstructive sleep apnea (adult) (pediatric): Secondary | ICD-10-CM | POA: Diagnosis not present

## 2023-11-26 DIAGNOSIS — G4733 Obstructive sleep apnea (adult) (pediatric): Secondary | ICD-10-CM | POA: Diagnosis not present

## 2023-12-02 DIAGNOSIS — M542 Cervicalgia: Secondary | ICD-10-CM | POA: Diagnosis not present

## 2023-12-02 DIAGNOSIS — Z6833 Body mass index (BMI) 33.0-33.9, adult: Secondary | ICD-10-CM | POA: Diagnosis not present

## 2023-12-04 DIAGNOSIS — N1832 Chronic kidney disease, stage 3b: Secondary | ICD-10-CM | POA: Diagnosis not present

## 2023-12-09 ENCOUNTER — Ambulatory Visit: Payer: Medicare PPO | Attending: Student | Admitting: Physical Therapy

## 2023-12-09 ENCOUNTER — Other Ambulatory Visit: Payer: Self-pay

## 2023-12-09 DIAGNOSIS — M62838 Other muscle spasm: Secondary | ICD-10-CM | POA: Diagnosis not present

## 2023-12-09 DIAGNOSIS — M542 Cervicalgia: Secondary | ICD-10-CM | POA: Insufficient documentation
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
## 2023-12-11 ENCOUNTER — Ambulatory Visit: Admitting: *Deleted

## 2023-12-11 DIAGNOSIS — D631 Anemia in chronic kidney disease: Secondary | ICD-10-CM | POA: Diagnosis not present

## 2023-12-11 DIAGNOSIS — I129 Hypertensive chronic kidney disease with stage 1 through stage 4 chronic kidney disease, or unspecified chronic kidney disease: Secondary | ICD-10-CM | POA: Diagnosis not present

## 2023-12-11 DIAGNOSIS — N2581 Secondary hyperparathyroidism of renal origin: Secondary | ICD-10-CM | POA: Diagnosis not present

## 2023-12-11 DIAGNOSIS — M542 Cervicalgia: Secondary | ICD-10-CM

## 2023-12-11 DIAGNOSIS — M62838 Other muscle spasm: Secondary | ICD-10-CM

## 2023-12-11 DIAGNOSIS — N1832 Chronic kidney disease, stage 3b: Secondary | ICD-10-CM | POA: Diagnosis not present
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
## 2023-12-16 ENCOUNTER — Ambulatory Visit: Admitting: *Deleted

## 2023-12-16 ENCOUNTER — Encounter: Payer: Self-pay | Admitting: *Deleted

## 2023-12-16 DIAGNOSIS — M542 Cervicalgia: Secondary | ICD-10-CM

## 2023-12-16 DIAGNOSIS — M62838 Other muscle spasm: Secondary | ICD-10-CM | POA: Diagnosis not present
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
## 2023-12-18 ENCOUNTER — Ambulatory Visit: Admitting: Physical Therapy

## 2023-12-18 DIAGNOSIS — M542 Cervicalgia: Secondary | ICD-10-CM

## 2023-12-18 DIAGNOSIS — M62838 Other muscle spasm: Secondary | ICD-10-CM | POA: Diagnosis not present
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
## 2023-12-19 DIAGNOSIS — Z6832 Body mass index (BMI) 32.0-32.9, adult: Secondary | ICD-10-CM | POA: Diagnosis not present

## 2023-12-19 DIAGNOSIS — Z0000 Encounter for general adult medical examination without abnormal findings: Secondary | ICD-10-CM | POA: Diagnosis not present

## 2023-12-19 DIAGNOSIS — E7800 Pure hypercholesterolemia, unspecified: Secondary | ICD-10-CM | POA: Diagnosis not present

## 2023-12-19 DIAGNOSIS — Z1211 Encounter for screening for malignant neoplasm of colon: Secondary | ICD-10-CM | POA: Diagnosis not present

## 2023-12-19 DIAGNOSIS — K219 Gastro-esophageal reflux disease without esophagitis: Secondary | ICD-10-CM | POA: Diagnosis not present

## 2023-12-19 DIAGNOSIS — I10 Essential (primary) hypertension: Secondary | ICD-10-CM | POA: Diagnosis not present

## 2023-12-19 DIAGNOSIS — N5201 Erectile dysfunction due to arterial insufficiency: Secondary | ICD-10-CM | POA: Diagnosis not present

## 2023-12-19 DIAGNOSIS — Z125 Encounter for screening for malignant neoplasm of prostate: Secondary | ICD-10-CM | POA: Diagnosis not present

## 2024-01-07 ENCOUNTER — Ambulatory Visit: Attending: Student | Admitting: Physical Therapy

## 2024-01-07 DIAGNOSIS — M542 Cervicalgia: Secondary | ICD-10-CM | POA: Diagnosis not present

## 2024-01-07 DIAGNOSIS — M62838 Other muscle spasm: Secondary | ICD-10-CM | POA: Diagnosis not present
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
## 2024-01-13 ENCOUNTER — Ambulatory Visit: Admitting: Physical Therapy

## 2024-01-13 DIAGNOSIS — M542 Cervicalgia: Secondary | ICD-10-CM | POA: Diagnosis not present

## 2024-01-13 DIAGNOSIS — G4733 Obstructive sleep apnea (adult) (pediatric): Secondary | ICD-10-CM | POA: Diagnosis not present

## 2024-01-13 DIAGNOSIS — M62838 Other muscle spasm: Secondary | ICD-10-CM
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
## 2024-01-15 ENCOUNTER — Ambulatory Visit: Admitting: Physical Therapy

## 2024-01-15 DIAGNOSIS — M62838 Other muscle spasm: Secondary | ICD-10-CM | POA: Diagnosis not present

## 2024-01-15 DIAGNOSIS — M542 Cervicalgia: Secondary | ICD-10-CM
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
## 2024-01-21 ENCOUNTER — Ambulatory Visit: Admitting: Physical Therapy

## 2024-01-21 DIAGNOSIS — M62838 Other muscle spasm: Secondary | ICD-10-CM | POA: Diagnosis not present

## 2024-01-21 DIAGNOSIS — M542 Cervicalgia: Secondary | ICD-10-CM
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
## 2024-02-10 DIAGNOSIS — Z6833 Body mass index (BMI) 33.0-33.9, adult: Secondary | ICD-10-CM | POA: Diagnosis not present

## 2024-02-10 DIAGNOSIS — M79674 Pain in right toe(s): Secondary | ICD-10-CM | POA: Diagnosis not present

## 2024-02-11 DIAGNOSIS — Z6833 Body mass index (BMI) 33.0-33.9, adult: Secondary | ICD-10-CM | POA: Diagnosis not present

## 2024-02-11 DIAGNOSIS — M542 Cervicalgia: Secondary | ICD-10-CM | POA: Diagnosis not present

## 2024-02-24 DIAGNOSIS — G4733 Obstructive sleep apnea (adult) (pediatric): Secondary | ICD-10-CM | POA: Diagnosis not present

## 2024-03-04 DIAGNOSIS — M50223 Other cervical disc displacement at C6-C7 level: Secondary | ICD-10-CM | POA: Diagnosis not present

## 2024-03-04 DIAGNOSIS — M4802 Spinal stenosis, cervical region: Secondary | ICD-10-CM | POA: Diagnosis not present

## 2024-03-04 DIAGNOSIS — M50222 Other cervical disc displacement at C5-C6 level: Secondary | ICD-10-CM | POA: Diagnosis not present

## 2024-03-04 DIAGNOSIS — M542 Cervicalgia: Secondary | ICD-10-CM | POA: Diagnosis not present

## 2024-03-04 DIAGNOSIS — M50221 Other cervical disc displacement at C4-C5 level: Secondary | ICD-10-CM | POA: Diagnosis not present

## 2024-03-12 DIAGNOSIS — M542 Cervicalgia: Secondary | ICD-10-CM | POA: Diagnosis not present

## 2024-04-06 DIAGNOSIS — M5412 Radiculopathy, cervical region: Secondary | ICD-10-CM | POA: Diagnosis not present

## 2024-04-16 DIAGNOSIS — M542 Cervicalgia: Secondary | ICD-10-CM | POA: Diagnosis not present

## 2024-04-23 DIAGNOSIS — M5412 Radiculopathy, cervical region: Secondary | ICD-10-CM | POA: Diagnosis not present

## 2024-04-28 ENCOUNTER — Other Ambulatory Visit: Payer: Self-pay | Admitting: Neurosurgery

## 2024-05-13 DIAGNOSIS — M5412 Radiculopathy, cervical region: Secondary | ICD-10-CM | POA: Diagnosis not present

## 2024-05-18 NOTE — Pre-Procedure Instructions (Signed)
 Surgical Instructions   Your procedure is scheduled on May 25, 2024. Report to [facility] Main Entrance A at 8:00 A.M., then check in with the Admitting office. Any questions or running late day of surgery: call [phone]  Questions prior to your surgery date: call [phone], Monday-Friday, 8am-4pm. If you experience any cold or flu symptoms such as cough, fever, chills, shortness of breath, etc. between now and your scheduled surgery, please notify us  at the above number.     Remember:  Do not eat or drink after midnight the night before your surgery    Take these medicines the morning of surgery with A SIP OF WATER : rosuvastatin  (CRESTOR )    May take these medicines IF NEEDED: acetaminophen  (TYLENOL )  HYDROcodone -acetaminophen  (NORCO/VICODIN)  Tetrahydrozoline HCl (VISINE OP) eye drops   One week prior to surgery, STOP taking any Aspirin (unless otherwise instructed by your surgeon) Aleve, Naproxen, Ibuprofen , Motrin , Advil , Goody's, BC's, all herbal medications, fish oil, and non-prescription vitamins.                     Do NOT Smoke (Tobacco/Vaping) for 24 hours prior to your procedure.  If you use a CPAP at night, you may bring your mask/headgear for your overnight stay.   You will be asked to remove any contacts, glasses, piercing's, hearing aid's, dentures/partials prior to surgery. Please bring cases for these items if needed.    Patients discharged the day of surgery will not be allowed to drive home, and someone needs to stay with them for 24 hours.  SURGICAL WAITING ROOM VISITATION Patients may have no more than 2 support people in the waiting area - these visitors may rotate.   Pre-op nurse will coordinate an appropriate time for 1 ADULT support person, who may not rotate, to accompany patient in pre-op.  Children under the age of 36 must have an adult with them who is not the patient and must remain in the main waiting area with an adult.  If the  patient needs to stay at the hospital during part of their recovery, the visitor guidelines for inpatient rooms apply.  Please refer to the [facility] website for the visitor guidelines for any additional information.   If you received a COVID test during your pre-op visit  it is requested that you wear a mask when out in public, stay away from anyone that may not be feeling well and notify your surgeon if you develop symptoms. If you have been in contact with anyone that has tested positive in the last 10 days please notify you surgeon.      Pre-operative 5 CHG Bathing Instructions   You can play a key role in reducing the risk of infection after surgery. Your skin needs to be as free of germs as possible. You can reduce the number of germs on your skin by washing with CHG (chlorhexidine  gluconate) soap before surgery. CHG is an antiseptic soap that kills germs and continues to kill germs even after washing.   DO NOT use if you have an allergy to chlorhexidine /CHG or antibacterial soaps. If your skin becomes reddened or irritated, stop using the CHG and notify one of our RNs at [phone].   Please shower with the CHG soap starting 4 days before surgery using the following schedule:     Please keep in mind the following:  DO NOT shave, including legs and underarms, starting the day of your first shower.   You may shave your  face at any point before/day of surgery.  Place clean sheets on your bed the day you start using CHG soap. Use a clean washcloth (not used since being washed) for each shower. DO NOT sleep with pets once you start using the CHG.   CHG Shower Instructions:  Wash your face and private area with normal soap. If you choose to wash your hair, wash first with your normal shampoo.  After you use shampoo/soap, rinse your hair and body thoroughly to remove shampoo/soap residue.  Turn the water  OFF and apply about 3 tablespoons (45 ml) of CHG soap to a CLEAN washcloth.   Apply CHG soap ONLY FROM YOUR NECK DOWN TO YOUR TOES (washing for 3-5 minutes)  DO NOT use CHG soap on face, private areas, open wounds, or sores.  Pay special attention to the area where your surgery is being performed.  If you are having back surgery, having someone wash your back for you may be helpful. Wait 2 minutes after CHG soap is applied, then you may rinse off the CHG soap.  Pat dry with a clean towel  Put on clean clothes/pajamas   If you choose to wear lotion, please use ONLY the CHG-compatible lotions that are listed below.  Additional instructions for the day of surgery: DO NOT APPLY any lotions, deodorants, cologne, or perfumes.   Do not bring valuables to the hospital. [facility] is not responsible for any belongings/valuables. Do not wear nail polish, gel polish, artificial nails, or any other type of covering on natural nails (fingers and toes) Do not wear jewelry or makeup Put on clean/comfortable clothes.  Please brush your teeth.  Ask your nurse before applying any prescription medications to the skin.     CHG Compatible Lotions   Aveeno Moisturizing lotion  Cetaphil Moisturizing Cream  Cetaphil Moisturizing Lotion  Clairol Herbal Essence Moisturizing Lotion, Dry Skin  Clairol Herbal Essence Moisturizing Lotion, Extra Dry Skin  Clairol Herbal Essence Moisturizing Lotion, Normal Skin  Curel Age Defying Therapeutic Moisturizing Lotion with Alpha Hydroxy  Curel Extreme Care Body Lotion  Curel Soothing Hands Moisturizing Hand Lotion  Curel Therapeutic Moisturizing Cream, Fragrance-Free  Curel Therapeutic Moisturizing Lotion, Fragrance-Free  Curel Therapeutic Moisturizing Lotion, Original Formula  Eucerin Daily Replenishing Lotion  Eucerin Dry Skin Therapy Plus Alpha Hydroxy Crme  Eucerin Dry Skin Therapy Plus Alpha Hydroxy Lotion  Eucerin Original Crme  Eucerin Original Lotion  Eucerin Plus Crme Eucerin Plus Lotion  Eucerin TriLipid Replenishing  Lotion  Keri Anti-Bacterial Hand Lotion  Keri Deep Conditioning Original Lotion Dry Skin Formula Softly Scented  Keri Deep Conditioning Original Lotion, Fragrance Free Sensitive Skin Formula  Keri Lotion Fast Absorbing Fragrance Free Sensitive Skin Formula  Keri Lotion Fast Absorbing Softly Scented Dry Skin Formula  Keri Original Lotion  Keri Skin Renewal Lotion Keri Silky Smooth Lotion  Keri Silky Smooth Sensitive Skin Lotion  Nivea Body Creamy Conditioning Oil  Nivea Body Extra Enriched Lotion  Nivea Body Original Lotion  Nivea Body Sheer Moisturizing Lotion Nivea Crme  Nivea Skin Firming Lotion  NutraDerm 30 Skin Lotion  NutraDerm Skin Lotion  NutraDerm Therapeutic Skin Cream  NutraDerm Therapeutic Skin Lotion  ProShield Protective Hand Cream  Provon moisturizing lotion  Please read over the following fact sheets that you were given.

## 2024-05-19 ENCOUNTER — Encounter (HOSPITAL_COMMUNITY): Payer: Self-pay

## 2024-05-19 ENCOUNTER — Encounter (HOSPITAL_COMMUNITY)
Admission: RE | Admit: 2024-05-19 | Discharge: 2024-05-19 | Disposition: A | Discharge status: Home / Self Care | Source: Ambulatory Visit | Attending: Neurosurgery | Admitting: Neurosurgery

## 2024-05-19 ENCOUNTER — Other Ambulatory Visit: Payer: Self-pay

## 2024-05-19 VITALS — BP 150/102 | HR 63 | Temp 97.9°F | Resp 18 | Ht 67.0 in | Wt 216.1 lb

## 2024-05-19 DIAGNOSIS — I2510 Atherosclerotic heart disease of native coronary artery without angina pectoris: Secondary | ICD-10-CM | POA: Diagnosis not present

## 2024-05-19 DIAGNOSIS — Z01818 Encounter for other preprocedural examination: Secondary | ICD-10-CM | POA: Diagnosis not present

## 2024-05-19 HISTORY — DX: Chronic kidney disease, unspecified: N18.9

## 2024-05-19 HISTORY — DX: Unspecified osteoarthritis, unspecified site: M19.90

## 2024-05-19 LAB — CBC
HCT: 43 % (ref 39.0–52.0)
Hemoglobin: 13.8 g/dL (ref 13.0–17.0)
MCH: 30.7 pg (ref 26.0–34.0)
MCHC: 32.1 g/dL (ref 30.0–36.0)
MCV: 95.8 fL (ref 80.0–100.0)
Platelets: 208 K/uL (ref 150–400)
RBC: 4.49 MIL/uL (ref 4.22–5.81)
RDW: 12.3 % (ref 11.5–15.5)
WBC: 7.7 K/uL (ref 4.0–10.5)
nRBC: 0 % (ref 0.0–0.2)

## 2024-05-19 LAB — SURGICAL PCR SCREEN
MRSA, PCR: NEGATIVE
Staphylococcus aureus: NEGATIVE

## 2024-05-19 LAB — BASIC METABOLIC PANEL WITH GFR
Anion gap: 11 (ref 5–15)
BUN: 16 mg/dL (ref 8–23)
CO2: 23 mmol/L (ref 22–32)
Calcium: 9.4 mg/dL (ref 8.9–10.3)
Chloride: 106 mmol/L (ref 98–111)
Creatinine, Ser: 1.89 mg/dL — ABNORMAL HIGH (ref 0.61–1.24)
GFR, Estimated: 38 mL/min — ABNORMAL LOW (ref >60)
Glucose, Bld: 101 mg/dL — ABNORMAL HIGH (ref 70–99)
Potassium: 4.5 mmol/L (ref 3.5–5.1)
Sodium: 140 mmol/L (ref 135–145)
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
## 2024-05-25 ENCOUNTER — Ambulatory Visit (HOSPITAL_COMMUNITY)

## 2024-05-25 ENCOUNTER — Encounter (HOSPITAL_COMMUNITY)
Admission: RE | Disposition: A | Discharge status: Home / Self Care | Payer: Self-pay | Source: Home / Self Care | Attending: Neurosurgery

## 2024-05-25 ENCOUNTER — Ambulatory Visit (HOSPITAL_COMMUNITY)
Admission: RE | Admit: 2024-05-25 | Discharge: 2024-05-25 | Disposition: A | Discharge status: Home / Self Care | Attending: Neurosurgery | Admitting: Neurosurgery

## 2024-05-25 ENCOUNTER — Other Ambulatory Visit: Payer: Self-pay

## 2024-05-25 ENCOUNTER — Ambulatory Visit (HOSPITAL_COMMUNITY): Admitting: Certified Registered Nurse Anesthetist

## 2024-05-25 ENCOUNTER — Ambulatory Visit (HOSPITAL_COMMUNITY): Payer: Self-pay | Admitting: Physician Assistant

## 2024-05-25 ENCOUNTER — Encounter (HOSPITAL_COMMUNITY): Payer: Self-pay | Admitting: Neurosurgery

## 2024-05-25 DIAGNOSIS — G4733 Obstructive sleep apnea (adult) (pediatric): Secondary | ICD-10-CM | POA: Insufficient documentation

## 2024-05-25 DIAGNOSIS — Z87891 Personal history of nicotine dependence: Secondary | ICD-10-CM

## 2024-05-25 DIAGNOSIS — M5412 Radiculopathy, cervical region: Secondary | ICD-10-CM

## 2024-05-25 DIAGNOSIS — M4802 Spinal stenosis, cervical region: Secondary | ICD-10-CM | POA: Diagnosis not present

## 2024-05-25 DIAGNOSIS — I129 Hypertensive chronic kidney disease with stage 1 through stage 4 chronic kidney disease, or unspecified chronic kidney disease: Secondary | ICD-10-CM | POA: Insufficient documentation

## 2024-05-25 DIAGNOSIS — G4730 Sleep apnea, unspecified: Secondary | ICD-10-CM | POA: Insufficient documentation

## 2024-05-25 DIAGNOSIS — M4722 Other spondylosis with radiculopathy, cervical region: Secondary | ICD-10-CM | POA: Diagnosis not present

## 2024-05-25 DIAGNOSIS — Z86718 Personal history of other venous thrombosis and embolism: Secondary | ICD-10-CM | POA: Diagnosis not present

## 2024-05-25 DIAGNOSIS — Z79899 Other long term (current) drug therapy: Secondary | ICD-10-CM | POA: Diagnosis not present

## 2024-05-25 DIAGNOSIS — K219 Gastro-esophageal reflux disease without esophagitis: Secondary | ICD-10-CM | POA: Diagnosis not present

## 2024-05-25 DIAGNOSIS — Z981 Arthrodesis status: Secondary | ICD-10-CM | POA: Diagnosis not present

## 2024-05-25 DIAGNOSIS — N183 Chronic kidney disease, stage 3 unspecified: Secondary | ICD-10-CM | POA: Insufficient documentation

## 2024-05-25 DIAGNOSIS — I10 Essential (primary) hypertension: Secondary | ICD-10-CM | POA: Diagnosis not present

## 2024-05-25 HISTORY — PX: ANTERIOR CERVICAL DECOMP/DISCECTOMY FUSION: SHX1161

## 2024-05-25 SURGERY — ANTERIOR CERVICAL DECOMPRESSION/DISCECTOMY FUSION 2 LEVELS
Anesthesia: General | Site: Neck

## 2024-05-25 MED ORDER — CEFAZOLIN SODIUM-DEXTROSE 2-4 GM/100ML-% IV SOLN
2.0000 g | INTRAVENOUS | Status: AC
  Administered 2024-05-25: 2 g via INTRAVENOUS
  Filled 2024-05-25: qty 100

## 2024-05-25 MED ORDER — THROMBIN (RECOMBINANT) 5000 UNITS EX SOLR
CUTANEOUS | Status: DC | PRN
  Administered 2024-05-25: 10 mL

## 2024-05-25 MED ORDER — DEXAMETHASONE SODIUM PHOSPHATE 10 MG/ML IJ SOLN
INTRAMUSCULAR | Status: DC | PRN
  Administered 2024-05-25: 10 mg via INTRAVENOUS

## 2024-05-25 MED ORDER — ROCURONIUM BROMIDE 10 MG/ML (PF) SYRINGE
PREFILLED_SYRINGE | INTRAVENOUS | Status: DC | PRN
  Administered 2024-05-25: 20 mg via INTRAVENOUS
  Administered 2024-05-25: 80 mg via INTRAVENOUS

## 2024-05-25 MED ORDER — DIPHENHYDRAMINE-APAP (SLEEP) 25-500 MG PO TABS: 2.0000 | ORAL_TABLET | Freq: Every day | ORAL | Status: DC

## 2024-05-25 MED ORDER — MIDAZOLAM HCL 2 MG/2ML IJ SOLN
INTRAMUSCULAR | Status: AC
  Filled 2024-05-25: qty 2

## 2024-05-25 MED ORDER — LIDOCAINE 2% (20 MG/ML) 5 ML SYRINGE
INTRAMUSCULAR | Status: AC
  Filled 2024-05-25: qty 5

## 2024-05-25 MED ORDER — SODIUM CHLORIDE 0.9 % IV SOLN
250.0000 mL | INTRAVENOUS | Status: DC
  Administered 2024-05-25: 250 mL via INTRAVENOUS

## 2024-05-25 MED ORDER — FENTANYL CITRATE (PF) 100 MCG/2ML IJ SOLN
INTRAMUSCULAR | Status: AC
Start: 2024-05-25 — End: 2024-05-25
  Filled 2024-05-25: qty 2

## 2024-05-25 MED ORDER — PHENOL 1.4 % MT LIQD: 1.0000 | OROMUCOSAL | Status: DC | PRN

## 2024-05-25 MED ORDER — LIDOCAINE HCL (CARDIAC) PF 100 MG/5ML IV SOSY
PREFILLED_SYRINGE | INTRAVENOUS | Status: DC | PRN
  Administered 2024-05-25: 80 mg via INTRAVENOUS
  Administered 2024-05-25: 20 mg via INTRAVENOUS

## 2024-05-25 MED ORDER — 0.9 % SODIUM CHLORIDE (POUR BTL) OPTIME
TOPICAL | Status: DC | PRN
  Administered 2024-05-25: 1000 mL

## 2024-05-25 MED ORDER — ONDANSETRON HCL 4 MG/2ML IJ SOLN: 4.0000 mg | Freq: Four times a day (QID) | INTRAMUSCULAR | Status: DC | PRN

## 2024-05-25 MED ORDER — PROPOFOL 10 MG/ML IV BOLUS
INTRAVENOUS | Status: DC | PRN
  Administered 2024-05-25: 150 mg via INTRAVENOUS

## 2024-05-25 MED ORDER — LORATADINE 10 MG PO TABS: 10.0000 mg | ORAL_TABLET | Freq: Every evening | ORAL | Status: DC

## 2024-05-25 MED ORDER — HYDROMORPHONE HCL 1 MG/ML IJ SOLN
INTRAMUSCULAR | Status: AC
Start: 2024-05-25 — End: 2024-05-25
  Filled 2024-05-25: qty 0.5

## 2024-05-25 MED ORDER — PHENYLEPHRINE 80 MCG/ML (10ML) SYRINGE FOR IV PUSH (FOR BLOOD PRESSURE SUPPORT)
PREFILLED_SYRINGE | INTRAVENOUS | Status: AC
  Filled 2024-05-25: qty 10

## 2024-05-25 MED ORDER — CYCLOBENZAPRINE HCL 10 MG PO TABS: 10.0000 mg | ORAL_TABLET | Freq: Three times a day (TID) | ORAL | Status: DC | PRN

## 2024-05-25 MED ORDER — LACTATED RINGERS IV SOLN: INTRAVENOUS | Status: DC

## 2024-05-25 MED ORDER — THROMBIN 5000 UNITS EX SOLR
OROMUCOSAL | Status: DC | PRN
  Administered 2024-05-25 (×2): 5 mL

## 2024-05-25 MED ORDER — PROPOFOL 10 MG/ML IV BOLUS
INTRAVENOUS | Status: AC
  Filled 2024-05-25: qty 20

## 2024-05-25 MED ORDER — SUGAMMADEX SODIUM 200 MG/2ML IV SOLN
INTRAVENOUS | Status: DC | PRN
  Administered 2024-05-25: 200 mg via INTRAVENOUS

## 2024-05-25 MED ORDER — CYCLOBENZAPRINE HCL 10 MG PO TABS: 10.0000 mg | ORAL_TABLET | Freq: Three times a day (TID) | ORAL | 0 refills | Status: AC | PRN

## 2024-05-25 MED ORDER — DIPHENHYDRAMINE HCL 25 MG PO CAPS: 50.0000 mg | ORAL_CAPSULE | Freq: Every day | ORAL | Status: DC

## 2024-05-25 MED ORDER — MENTHOL 3 MG MT LOZG: 1.0000 | LOZENGE | OROMUCOSAL | Status: DC | PRN

## 2024-05-25 MED ORDER — TURMERIC CURCUMIN 500 MG PO CAPS: 1000.0000 mg | ORAL_CAPSULE | Freq: Every day | ORAL | Status: DC

## 2024-05-25 MED ORDER — ADULT MULTIVITAMIN W/MINERALS CH: 1.0000 | ORAL_TABLET | Freq: Every day | ORAL | Status: DC

## 2024-05-25 MED ORDER — CHLORHEXIDINE GLUCONATE CLOTH 2 % EX PADS: 6.0000 | MEDICATED_PAD | Freq: Once | CUTANEOUS | Status: DC

## 2024-05-25 MED ORDER — CEFAZOLIN SODIUM-DEXTROSE 2-4 GM/100ML-% IV SOLN
2.0000 g | Freq: Three times a day (TID) | INTRAVENOUS | Status: DC
  Administered 2024-05-25: 2 g via INTRAVENOUS
  Filled 2024-05-25: qty 100

## 2024-05-25 MED ORDER — CINNAMON 500 MG PO TABS: 500.0000 mg | ORAL_TABLET | Freq: Every day | ORAL | Status: DC

## 2024-05-25 MED ORDER — PHENYLEPHRINE HCL-NACL 20-0.9 MG/250ML-% IV SOLN
INTRAVENOUS | Status: DC | PRN
  Administered 2024-05-25: 25 ug/min via INTRAVENOUS

## 2024-05-25 MED ORDER — ALUM & MAG HYDROXIDE-SIMETH 200-200-20 MG/5ML PO SUSP: 30.0000 mL | Freq: Four times a day (QID) | ORAL | Status: DC | PRN

## 2024-05-25 MED ORDER — PANTOPRAZOLE SODIUM 40 MG PO TBEC: 40.0000 mg | DELAYED_RELEASE_TABLET | Freq: Every day | ORAL | Status: DC

## 2024-05-25 MED ORDER — HYDROCODONE-ACETAMINOPHEN 5-325 MG PO TABS: 1.0000 | ORAL_TABLET | ORAL | 0 refills | Status: AC | PRN

## 2024-05-25 MED ORDER — THROMBIN 5000 UNITS EX KIT
PACK | CUTANEOUS | Status: AC
  Filled 2024-05-25: qty 3

## 2024-05-25 MED ORDER — LEVOCETIRIZINE DIHYDROCHLORIDE 5 MG PO TABS: 5.0000 mg | ORAL_TABLET | Freq: Every evening | ORAL | Status: DC

## 2024-05-25 MED ORDER — SODIUM CHLORIDE 0.9% FLUSH
3.0000 mL | Freq: Two times a day (BID) | INTRAVENOUS | Status: DC
  Administered 2024-05-25: 3 mL via INTRAVENOUS

## 2024-05-25 MED ORDER — LACTATED RINGERS IV SOLN: INTRAVENOUS | Status: DC | PRN

## 2024-05-25 MED ORDER — FENTANYL CITRATE (PF) 250 MCG/5ML IJ SOLN
INTRAMUSCULAR | Status: AC
  Filled 2024-05-25: qty 5

## 2024-05-25 MED ORDER — PHENYLEPHRINE 80 MCG/ML (10ML) SYRINGE FOR IV PUSH (FOR BLOOD PRESSURE SUPPORT)
PREFILLED_SYRINGE | INTRAVENOUS | Status: DC | PRN
  Administered 2024-05-25 (×2): 80 ug via INTRAVENOUS

## 2024-05-25 MED ORDER — VITAMIN D 25 MCG (1000 UNIT) PO TABS: 2000.0000 [IU] | ORAL_TABLET | ORAL | Status: DC

## 2024-05-25 MED ORDER — HYDROCODONE-ACETAMINOPHEN 5-325 MG PO TABS: 1.0000 | ORAL_TABLET | Freq: Every day | ORAL | Status: DC | PRN

## 2024-05-25 MED ORDER — HYDROMORPHONE HCL 1 MG/ML IJ SOLN
INTRAMUSCULAR | Status: DC | PRN
  Administered 2024-05-25: .5 mg via INTRAVENOUS

## 2024-05-25 MED ORDER — DEXAMETHASONE SODIUM PHOSPHATE 10 MG/ML IJ SOLN
INTRAMUSCULAR | Status: AC
  Filled 2024-05-25: qty 1

## 2024-05-25 MED ORDER — ACETAMINOPHEN 10 MG/ML IV SOLN: 1000.0000 mg | Freq: Once | INTRAVENOUS | Status: DC | PRN

## 2024-05-25 MED ORDER — ACETAMINOPHEN 500 MG PO TABS: 1000.0000 mg | ORAL_TABLET | Freq: Every day | ORAL | Status: DC

## 2024-05-25 MED ORDER — ACETAMINOPHEN 325 MG PO TABS: 650.0000 mg | ORAL_TABLET | ORAL | Status: DC | PRN

## 2024-05-25 MED ORDER — ONDANSETRON HCL 4 MG/2ML IJ SOLN
INTRAMUSCULAR | Status: AC
  Filled 2024-05-25: qty 2

## 2024-05-25 MED ORDER — ROSUVASTATIN CALCIUM 5 MG PO TABS: 5.0000 mg | ORAL_TABLET | Freq: Every day | ORAL | Status: DC

## 2024-05-25 MED ORDER — HYDROCODONE-ACETAMINOPHEN 5-325 MG PO TABS: 2.0000 | ORAL_TABLET | ORAL | Status: DC | PRN

## 2024-05-25 MED ORDER — HYDROMORPHONE HCL 1 MG/ML IJ SOLN: 0.5000 mg | INTRAMUSCULAR | Status: DC | PRN

## 2024-05-25 MED ORDER — ONDANSETRON HCL 4 MG/2ML IJ SOLN
INTRAMUSCULAR | Status: DC | PRN
  Administered 2024-05-25: 4 mg via INTRAVENOUS

## 2024-05-25 MED ORDER — FENTANYL CITRATE (PF) 100 MCG/2ML IJ SOLN
25.0000 ug | INTRAMUSCULAR | Status: DC | PRN
  Administered 2024-05-25: 25 ug via INTRAVENOUS

## 2024-05-25 MED ORDER — GARLIC 1000 MG PO CAPS: 1000.0000 mg | ORAL_CAPSULE | Freq: Every day | ORAL | Status: DC

## 2024-05-25 MED ORDER — OXYCODONE HCL 5 MG PO TABS: 5.0000 mg | ORAL_TABLET | Freq: Once | ORAL | Status: DC | PRN

## 2024-05-25 MED ORDER — CHLORHEXIDINE GLUCONATE 0.12 % MT SOLN
15.0000 mL | Freq: Once | OROMUCOSAL | Status: AC
  Administered 2024-05-25: 15 mL via OROMUCOSAL
  Filled 2024-05-25: qty 15

## 2024-05-25 MED ORDER — MIDAZOLAM HCL 2 MG/2ML IJ SOLN
INTRAMUSCULAR | Status: DC | PRN
  Administered 2024-05-25: 2 mg via INTRAVENOUS

## 2024-05-25 MED ORDER — ROCURONIUM BROMIDE 10 MG/ML (PF) SYRINGE
PREFILLED_SYRINGE | INTRAVENOUS | Status: AC
  Filled 2024-05-25: qty 10

## 2024-05-25 MED ORDER — PANTOPRAZOLE SODIUM 40 MG IV SOLR: 40.0000 mg | Freq: Every day | INTRAVENOUS | Status: DC

## 2024-05-25 MED ORDER — GLUCOSAMINE CHONDROITIN TRIPLE PO TABS: 1.0000 | ORAL_TABLET | Freq: Every day | ORAL | Status: DC

## 2024-05-25 MED ORDER — ONDANSETRON HCL 4 MG PO TABS: 4.0000 mg | ORAL_TABLET | Freq: Four times a day (QID) | ORAL | Status: DC | PRN

## 2024-05-25 MED ORDER — OXYCODONE HCL 5 MG/5ML PO SOLN: 5.0000 mg | Freq: Once | ORAL | Status: DC | PRN

## 2024-05-25 MED ORDER — VITAMIN C 500 MG PO TABS: 500.0000 mg | ORAL_TABLET | Freq: Every day | ORAL | Status: DC

## 2024-05-25 MED ORDER — ORAL CARE MOUTH RINSE: 15.0000 mL | Freq: Once | OROMUCOSAL | Status: AC

## 2024-05-25 MED ORDER — NAPHAZOLINE-PHENIRAMINE 0.025-0.3 % OP SOLN: 2.0000 [drp] | Freq: Every day | OPHTHALMIC | Status: DC | PRN

## 2024-05-25 MED ORDER — THROMBIN 5000 UNITS EX KIT
PACK | CUTANEOUS | Status: AC
  Filled 2024-05-25: qty 1

## 2024-05-25 MED ORDER — ACETAMINOPHEN 650 MG RE SUPP: 650.0000 mg | RECTAL | Status: DC | PRN

## 2024-05-25 MED ORDER — SODIUM CHLORIDE 0.9% FLUSH: 3.0000 mL | INTRAVENOUS | Status: DC | PRN

## 2024-05-25 MED ORDER — FENTANYL CITRATE (PF) 250 MCG/5ML IJ SOLN
INTRAMUSCULAR | Status: DC | PRN
  Administered 2024-05-25 (×3): 50 ug via INTRAVENOUS
  Administered 2024-05-25: 100 ug via INTRAVENOUS

## 2024-05-25 SURGICAL SUPPLY — 51 items
BAG COUNTER SPONGE SURGICOUNT (BAG) ×1 IMPLANT
BAND RUBBER #18 3X1/16 STRL (MISCELLANEOUS) ×2 IMPLANT
BASKET BONE COLLECTION (BASKET) ×1 IMPLANT
BENZOIN TINCTURE PRP APPL 2/3 (GAUZE/BANDAGES/DRESSINGS) ×1 IMPLANT
BIT DRILL NEURO 2X3.1 SFT TUCH (MISCELLANEOUS) ×1 IMPLANT
BUR MATCHSTICK NEURO 3.0 LAGG (BURR) ×1 IMPLANT
CANISTER SUCTION 3000ML PPV (SUCTIONS) ×1 IMPLANT
DERMABOND ADVANCED .7 DNX12 (GAUZE/BANDAGES/DRESSINGS) IMPLANT
DRAPE C-ARM 42X72 X-RAY (DRAPES) ×2 IMPLANT
DRAPE LAPAROTOMY 100X72 PEDS (DRAPES) ×1 IMPLANT
DRAPE MICROSCOPE SLANT 54X150 (MISCELLANEOUS) ×1 IMPLANT
DRSG OPSITE POSTOP 4X6 (GAUZE/BANDAGES/DRESSINGS) IMPLANT
DURAPREP 6ML APPLICATOR 50/CS (WOUND CARE) ×1 IMPLANT
ELECT COATED BLADE 2.86 ST (ELECTRODE) ×1 IMPLANT
ELECTRODE REM PT RTRN 9FT ADLT (ELECTROSURGICAL) ×1 IMPLANT
GAUZE 4X4 16PLY ~~LOC~~+RFID DBL (SPONGE) IMPLANT
GAUZE SPONGE 4X4 12PLY STRL (GAUZE/BANDAGES/DRESSINGS) ×1 IMPLANT
GLOVE BIO SURGEON STRL SZ7 (GLOVE) IMPLANT
GLOVE BIO SURGEON STRL SZ8 (GLOVE) ×1 IMPLANT
GLOVE BIOGEL PI IND STRL 7.0 (GLOVE) IMPLANT
GLOVE EXAM NITRILE XL STR (GLOVE) IMPLANT
GLOVE INDICATOR 8.5 STRL (GLOVE) ×1 IMPLANT
GOWN STRL REUS W/ TWL LRG LVL3 (GOWN DISPOSABLE) ×1 IMPLANT
GOWN STRL REUS W/ TWL XL LVL3 (GOWN DISPOSABLE) ×1 IMPLANT
GOWN STRL REUS W/TWL 2XL LVL3 (GOWN DISPOSABLE) IMPLANT
GRAFT BNE MATRIX VG FRMBL SM 1 (Bone Implant) IMPLANT
HALTER HD/CHIN CERV TRACTION D (MISCELLANEOUS) ×1 IMPLANT
HEMOSTAT POWDER KIT SURGIFOAM (HEMOSTASIS) ×1 IMPLANT
HEMOSTAT POWDER SURGIFOAM 1G (HEMOSTASIS) IMPLANT
KIT BASIN OR (CUSTOM PROCEDURE TRAY) ×1 IMPLANT
KIT TURNOVER KIT B (KITS) ×1 IMPLANT
NDL SPNL 20GX3.5 QUINCKE YW (NEEDLE) ×1 IMPLANT
NEEDLE SPNL 20GX3.5 QUINCKE YW (NEEDLE) ×1 IMPLANT
NS IRRIG 1000ML POUR BTL (IV SOLUTION) ×1 IMPLANT
PACK LAMINECTOMY NEURO (CUSTOM PROCEDURE TRAY) ×1 IMPLANT
PAD ARMBOARD POSITIONER FOAM (MISCELLANEOUS) ×3 IMPLANT
PIN DISTRACTION 14MM (PIN) IMPLANT
PLATE CERV RES 32 2LVL (Plate) IMPLANT
SCREW VA SD 4.6X14 (Screw) IMPLANT
SCREW VA SD RESONATE 4.2X14 (Screw) IMPLANT
SPACER HEDRON 14X16X6 0D (Spacer) IMPLANT
SPACER HEDRON C 14X16 8 7D (Spacer) IMPLANT
SPONGE INTESTINAL PEANUT (DISPOSABLE) ×1 IMPLANT
SPONGE SURGIFOAM ABS GEL SZ50 (HEMOSTASIS) ×1 IMPLANT
STRIP CLOSURE SKIN 1/2X4 (GAUZE/BANDAGES/DRESSINGS) ×1 IMPLANT
SUT VIC AB 3-0 SH 8-18 (SUTURE) ×1 IMPLANT
SUT VIC AB 4-0 PS2 18 (SUTURE) ×1 IMPLANT
TAPE CLOTH 4X10 WHT NS (GAUZE/BANDAGES/DRESSINGS) ×1 IMPLANT
TOWEL GREEN STERILE (TOWEL DISPOSABLE) ×1 IMPLANT
TOWEL GREEN STERILE FF (TOWEL DISPOSABLE) ×1 IMPLANT
WATER STERILE IRR 1000ML POUR (IV SOLUTION) ×1 IMPLANT

## 2024-05-25 NOTE — Anesthesia Procedure Notes (Signed)
 Procedure Name: Intubation Date/Time: 05/25/2024 10:20 AM  Performed by: Cindie Donald CROME, CRNAPre-anesthesia Checklist: Patient identified, Emergency Drugs available, Suction available and Patient being monitored Patient Re-evaluated:Patient Re-evaluated prior to induction Oxygen Delivery Method: Circle System Utilized Preoxygenation: Pre-oxygenation with 100% oxygen Induction Type: IV induction Ventilation: Mask ventilation without difficulty and Oral airway inserted - appropriate to patient size Laryngoscope Size: Glidescope and 4 Grade View: Grade I Tube type: Oral Tube size: 7.5 mm Number of attempts: 1 Airway Equipment and Method: Stylet and Oral airway Placement Confirmation: ETT inserted through vocal cords under direct vision, positive ETCO2 and breath sounds checked- equal and bilateral Secured at: 22 cm Tube secured with: Tape Dental Injury: Teeth and Oropharynx as per pre-operative assessment

## 2024-05-25 NOTE — Plan of Care (Signed)

## 2024-05-25 NOTE — Evaluation (Signed)
 Occupational Therapy Evaluation Patient Details Name: Daniel Patton MRN: [id number] DOB: 14-Jan-1956 Today's Date: 05/25/2024   History of Present Illness   68 yo M s/p ACDF.  PMH includes:  PLIF, HTN.     Clinical Impressions Patient admitted for the procedure above.  Currently presents at baseline for ADL and mobility.  Patient with good understanding of all precautions, needed no assist for ADL, and able to walk the halls without assist.  No further OT needs in the acute setting, recommend follow up as prescribed by MD.       If plan is discharge home, recommend the following:   Assist for transportation     Functional Status Assessment   Patient has not had a recent decline in their functional status     Equipment Recommendations   None recommended by OT     Recommendations for Other Services         Precautions/Restrictions   Precautions Precautions: Cervical Precaution Booklet Issued: Yes (comment) Recall of Precautions/Restrictions: Intact Required Braces or Orthoses: Cervical Brace Cervical Brace: Soft collar;For comfort Restrictions Weight Bearing Restrictions Per Provider Order: No     Mobility Bed Mobility Overal bed mobility: Modified Independent                  Transfers Overall transfer level: Independent                        Balance Overall balance assessment: No apparent balance deficits (not formally assessed)                                         ADL either performed or assessed with clinical judgement   ADL Overall ADL's : Independent                                             Vision Patient Visual Report: No change from baseline       Perception Perception: Not tested       Praxis Praxis: Not tested       Pertinent Vitals/Pain Pain Assessment Pain Assessment: No/denies pain     Extremity/Trunk Assessment Upper Extremity Assessment Upper Extremity  Assessment: Overall WFL for tasks assessed   Lower Extremity Assessment Lower Extremity Assessment: Overall WFL for tasks assessed   Cervical / Trunk Assessment Cervical / Trunk Assessment: Neck Surgery   Communication Communication Communication: No apparent difficulties Factors Affecting Communication: Hearing impaired   Cognition Arousal: Alert Behavior During Therapy: WFL for tasks assessed/performed Cognition: No apparent impairments                               Following commands: Intact       Cueing  General Comments   Cueing Techniques: Verbal cues   VSS on RA   Exercises     Shoulder Instructions      Home Living Family/patient expects to be discharged to:: Private residence Living Arrangements: Spouse/significant other Available Help at Discharge: Family;Available 24 hours/day Type of Home: House Home Access: Stairs to enter [employer] of Steps: 8 Entrance Stairs-Rails: Right;Left;Can reach both Home Layout: One level;Laundry or work area in basement     [employer]: Tub/shower unit  Bathroom Toilet: Handicapped height Bathroom Accessibility: No   Home Equipment: Information systems manager          Prior Functioning/Environment Prior Level of Function : Independent/Modified Independent;Driving                    OT Problem List: Decreased activity tolerance   OT Treatment/Interventions:        OT Goals(Current goals can be found in the care plan section)   Acute Rehab OT Goals Patient Stated Goal: Return home OT Goal Formulation: With patient Time For Goal Achievement: 05/28/24 Potential to Achieve Goals: Good   OT Frequency:       Co-evaluation              AM-PAC OT 6 Clicks Daily Activity     Outcome Measure Help from another person eating meals?: None Help from another person taking care of personal grooming?: None Help from another person toileting, which includes using toliet, bedpan, or  urinal?: None Help from another person bathing (including washing, rinsing, drying)?: None Help from another person to put on and taking off regular upper body clothing?: None Help from another person to put on and taking off regular lower body clothing?: None 6 Click Score: 24   End of Session Equipment Utilized During Treatment: Cervical collar Nurse Communication: Mobility status  Activity Tolerance: Patient tolerated treatment well Patient left: in bed;with call bell/phone within reach;with family/visitor present  OT Visit Diagnosis: Unsteadiness on feet (R26.81)                Time: 1540-1600 OT Time Calculation (min): 20 min Charges:  OT General Charges $OT Visit: 1 Visit OT Evaluation $OT Eval Moderate Complexity: 1 Mod  05/25/2024  RP, OTR/L  Acute Rehabilitation Services  Office:  [phone]   Charlie JONETTA Halsted 05/25/2024, 4:03 PM

## 2024-05-25 NOTE — Op Note (Signed)
 Preoperative diagnosis: Cervical spondylosis with radiculopathy C5-6 C6-7.  Postoperative diagnosis: Same.  Procedure: Anterior cervical discectomies and fusion C5-6 C6-7 utilizing the globus uterine titanium cages packed with locally harvested autograft mixed with Vivigen and anterior cervical plating utilizing the globus resonate plating system.  Surgeon: Arley helling.  Assistant: Suzen Click.  Anesthesia: General.  EBL: Minimal.  HPI: 68 year old man progressive worsening neck and primarily left shoulder and arm pain workup revealed severe cervical spondylosis with foraminal stenosis and central stenosis at C5-6 and C6-7.  Due to the patient's progression of clinical syndrome imaging findings and failure of conservative treatment I recommend anterior cervical discectomies and fusion at those levels.  I extensively reviewed the risks and benefits of the operation with him as well as perioperative course expectations of outcome and alternatives to surgery and he understood and agreed to proceed forward.  Operative procedure: Patient was brought into the OR was induced under general anesthesia and positioned supine with neck slight extension 5 pounds of halter traction.  The right side of his neck was prepped and draped in routine sterile fashion.  Preoperative x-ray localized the appropriate level.  A curvilinear incision was made just off midline to intraportal sternocleidomastoid and superficial abscess was dissected out divided longitudinally.  The avascular plane between the sternocleidomastoid and the strap was was developed down to the prevertebral fascia and prevertebral fascia was dissected away with Kitners.  Interoperative x-ray confirmed application of the C4-5 disc base level so attention was taken to the spaces below this annulotomy's were made with a 15 blade scalpel to mark the disc base anterior ossified's were bitten off with a Leksell rongeur.  Both the space were drilled down the  posterior annulus and osteophyte complex capturing the bone shavings the mucus trap under microscopic emanation first working at C6-7 under biting of both endplates allowed application posterior lateral ligament which was removed in piecemeal fashion.  Large posterior spurring was immediately noted this was all aggressively removed and under Bitton marching laterally identified both C7 pedicles and both C7 nerve roots were decompressed and skeletonized flush with the pedicle.  This was packed with Gelfoam attention taken to C5-6 in a similar fashion posterior large ligament was removed piecemeal fashion aggressive under biting of both endplates decompressing central canal both C6 nerve roots were identified and decompressed and skeletonized flush with the pedicle.  I then sized up a 8 mm lordotic implant at C6-7 and a 6 mm 0 degree at C5-6 both implants were inserted 2 mm deep to the intervertebral line extensive mount of autograft and Vivigen was then packed laterally and anteriorly and up to the cages I selected a 32 mm globus resonate plate all screws with excellent purchase locking mechanism was engaged the wound was then copiously irrigated meticulous hemostasis was maintained and the wound was closed in layers with interrupted Vicryl and protestant a running 4 subcuticular.  Dermabond benzoin Steri-Strips and a sterile dressing was applied patient to cover him in stable condition.  At the end the case final count sponge counts were correct.

## 2024-05-25 NOTE — Transfer of Care (Signed)
 Immediate Anesthesia Transfer of Care Note  Patient: Daniel Patton  Procedure(s) Performed: ANTERIOR CERVICAL DECOMPRESSION/DISCECTOMY FUSION 2 LEVELS (Neck)  Patient Location: PACU  Anesthesia Type:General  Level of Consciousness: awake, alert , oriented, and patient cooperative  Airway & Oxygen Therapy: Patient Spontanous Breathing and Patient connected to nasal cannula oxygen  Post-op Assessment: Report given to RN, Post -op Vital signs reviewed and stable, and Patient moving all extremities X 4  Post vital signs: Reviewed and stable  Last Vitals:  Vitals Value Taken Time  BP 156/90 05/25/24 12:45  Temp    Pulse 67 05/25/24 12:48  Resp 16 05/25/24 12:48  SpO2 96 % 05/25/24 12:48  Vitals shown include unfiled device data.  Last Pain:  Vitals:   05/25/24 0834  TempSrc:   PainSc: 0-No pain         Complications: No notable events documented.

## 2024-05-25 NOTE — Progress Notes (Signed)
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
## 2024-05-25 NOTE — Discharge Summary (Signed)
 Physician Discharge Summary  Patient ID: Daniel Patton MRN: [id number] DOB/AGE: 02-25-1956 68 y.o. Estimated body mass index is 33.67 kg/m as calculated from the following:   Height as of this encounter: 5' 7 (1.702 m).   Weight as of this encounter: 97.5 kg.   Admit date: 05/25/2024 Discharge date: 05/25/2024  Admission Diagnoses: Cervical spondylitic radiculopathy  Discharge Diagnoses: Same Principal Problem:   Spinal stenosis of cervical region   Discharged Condition: good  Hospital Course: Patient admitted to hospital underwent anterior cervical discectomies and fusion at C5-6 and C6-7 postoperative patient did very well In the floor on the floor was ambulating and voiding spontaneously tolerating regular diet stable for discharge home.  Patient be discharged with scheduled follow-up in 1 to 2 weeks.  Consults: Significant Diagnostic Studies: Treatments: ACDF C5-6 C6-7 Discharge Exam: Blood pressure (!) 163/99, pulse 70, temperature 98 F (36.7 C), resp. rate 18, height 5' 7 (1.702 m), weight 97.5 kg, SpO2 98%. ACDF C5-6 C6-7  Disposition: [facility]  Discharge Instructions      Remove dressing in 72 hours   Complete by: As directed    Call MD for:   Complete by: As directed    Call MD for:  difficulty breathing, headache or visual disturbances   Complete by: As directed    Call MD for:  hives   Complete by: As directed    Call MD for:  persistant nausea and vomiting   Complete by: As directed    Call MD for:  redness, tenderness, or signs of infection (pain, swelling, redness, odor or green/yellow discharge around incision site)   Complete by: As directed    Call MD for:  severe uncontrolled pain   Complete by: As directed    Call MD for:  temperature >100.4   Complete by: As directed    Diet - low sodium heart healthy   Complete by: As directed    Increase activity slowly   Complete by: As directed       Allergies as of 05/25/2024       Reactions   Amoxil  [amoxicillin] Diarrhea, Nausea And Vomiting   Has patient had a PCN reaction causing immediate rash, facial/tongue/throat swelling, SOB or lightheadedness with hypotension: No Has patient had a PCN reaction causing severe rash involving mucus membranes or skin necrosis: No Has patient had a PCN reaction that required hospitalization: No Has patient had a PCN reaction occurring within the last 10 years: No If all of the above answers are NO, then may proceed with Cephalosporin use.   Avelox [moxifloxacin] Diarrhea, Nausea And Vomiting, Other (See Comments)   GI UPSET   Shellfish-derived Products Nausea And Vomiting   Sulfa Antibiotics Nausea And Vomiting        Medication List     TAKE these medications    acetaminophen  325 MG tablet Commonly known as: Tylenol  Take 2 tablets (650 mg total) by mouth every 6 (six) hours as needed.   ascorbic acid 500 MG tablet Commonly known as: VITAMIN C  Take 500 mg by mouth daily.   Cinnamon  500 MG Tabs Take 500 mg by mouth daily.   cyclobenzaprine  10 MG tablet Commonly known as: FLEXERIL  Take 1 tablet (10 mg total) by mouth 3 (three) times daily as needed for muscle spasms.   D 2000 50 MCG (2000 UT) Tabs Generic drug: Cholecalciferol  Take 2,000 Units by mouth every other day.   diphenhydramine -acetaminophen  25-500 MG Tabs tablet Commonly known as: TYLENOL  PM Take 2 tablets  by mouth at bedtime.   Garlic  1000 MG Caps Take 1,000 mg by mouth daily.   Glucosamine Chondroitin Triple Tabs Take 1 tablet by mouth daily.   HYDROcodone -acetaminophen  5-325 MG tablet Commonly known as: NORCO/VICODIN Take 1 tablet by mouth every 4 (four) hours as needed for moderate pain (pain score 4-6). What changed: when to take this   levocetirizine 5 MG tablet Commonly known as: XYZAL  Take 5 mg by mouth every evening.   multivitamin with minerals Tabs tablet Take 1 tablet by mouth daily. Men's 50+   NON FORMULARY Pt uses a cpap nightly   OVER  THE COUNTER MEDICATION Place 1 Dose under the tongue at bedtime. CBD Oil   pantoprazole  40 MG tablet Commonly known as: PROTONIX  Take 40 mg by mouth at bedtime.   rosuvastatin  5 MG tablet Commonly known as: CRESTOR  Take 5 mg by mouth daily.   Turmeric Curcumin 500 MG Caps Take 1,000 mg by mouth daily.   VISINE OP Place 1 drop into both eyes daily as needed (redness).         Signed: Arley SHAUNNA Helling 05/25/2024, 3:16 PM

## 2024-05-26 NOTE — Anesthesia Postprocedure Evaluation (Signed)
 Anesthesia Post Note  Patient: Daniel Patton  Procedure(s) Performed: ANTERIOR CERVICAL DECOMPRESSION/DISCECTOMY FUSION 2 LEVELS (Neck)     Anesthesia Type: General Anesthetic complications: no   No notable events documented.                  Carlen Rebuck

## 2024-05-27 ENCOUNTER — Encounter (HOSPITAL_COMMUNITY): Payer: Self-pay | Admitting: Neurosurgery

## 2024-05-27 MED FILL — Thrombin For Soln 5000 Unit: CUTANEOUS | Qty: 2 | Status: AC

## 2024-05-27 MED FILL — Thrombin For Soln 5000 Unit: CUTANEOUS | Qty: 5000 | Status: AC

## 2024-06-12 DIAGNOSIS — G4733 Obstructive sleep apnea (adult) (pediatric): Secondary | ICD-10-CM | POA: Diagnosis not present

## 2024-07-07 DIAGNOSIS — M542 Cervicalgia: Secondary | ICD-10-CM | POA: Diagnosis not present

## 2024-07-07 DIAGNOSIS — Z6832 Body mass index (BMI) 32.0-32.9, adult: Secondary | ICD-10-CM | POA: Diagnosis not present

## 2024-07-15 DIAGNOSIS — L821 Other seborrheic keratosis: Secondary | ICD-10-CM | POA: Diagnosis not present

## 2024-07-15 DIAGNOSIS — D225 Melanocytic nevi of trunk: Secondary | ICD-10-CM | POA: Diagnosis not present

## 2024-07-15 DIAGNOSIS — L570 Actinic keratosis: Secondary | ICD-10-CM | POA: Diagnosis not present

## 2024-07-15 DIAGNOSIS — L578 Other skin changes due to chronic exposure to nonionizing radiation: Secondary | ICD-10-CM | POA: Diagnosis not present

## 2024-07-15 DIAGNOSIS — L814 Other melanin hyperpigmentation: Secondary | ICD-10-CM | POA: Diagnosis not present

## 2024-07-15 DIAGNOSIS — D2261 Melanocytic nevi of right upper limb, including shoulder: Secondary | ICD-10-CM | POA: Diagnosis not present

## 2024-08-18 DIAGNOSIS — M542 Cervicalgia: Secondary | ICD-10-CM | POA: Diagnosis not present
